# Patient Record
Sex: Male | Born: 1995 | Race: Black or African American | Hispanic: No | Marital: Single | Smoking: Never smoker
Health system: Southern US, Community
[De-identification: ages and names within clinical notes are randomized; demographics above are authoritative.]

## PROBLEM LIST (undated history)

## (undated) ENCOUNTER — Ambulatory Visit (HOSPITAL_COMMUNITY): Admission: EM | Payer: Self-pay | Source: Home / Self Care

## (undated) DIAGNOSIS — J302 Other seasonal allergic rhinitis: Secondary | ICD-10-CM

---
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
## 2015-05-27 ENCOUNTER — Encounter (HOSPITAL_COMMUNITY): Payer: Self-pay | Admitting: Emergency Medicine

## 2015-05-27 DIAGNOSIS — R197 Diarrhea, unspecified: Secondary | ICD-10-CM | POA: Insufficient documentation

## 2015-05-27 DIAGNOSIS — R112 Nausea with vomiting, unspecified: Secondary | ICD-10-CM | POA: Diagnosis present

## 2015-05-27 MED ORDER — ONDANSETRON 4 MG PO TBDP
ORAL_TABLET | ORAL | Status: AC
  Filled 2015-05-27: qty 1

## 2015-05-27 MED ORDER — ONDANSETRON 4 MG PO TBDP
4.0000 mg | ORAL_TABLET | Freq: Once | ORAL | Status: AC | PRN
  Administered 2015-05-27: 4 mg via ORAL

## 2015-05-27 NOTE — ED Notes (Signed)
Patient here with complaint of vomiting and diarrhea starting this AM upon waking. Denies sick contacts. Hasn't been able to tolerate PO intake today.

## 2015-05-28 ENCOUNTER — Emergency Department (HOSPITAL_COMMUNITY)
Admission: EM | Admit: 2015-05-28 | Discharge: 2015-05-28 | Disposition: A | Discharge status: Home / Self Care | Payer: Medicaid Other | Attending: Emergency Medicine | Admitting: Emergency Medicine

## 2015-05-28 DIAGNOSIS — R197 Diarrhea, unspecified: Secondary | ICD-10-CM

## 2015-05-28 DIAGNOSIS — R112 Nausea with vomiting, unspecified: Secondary | ICD-10-CM

## 2015-05-28 LAB — URINALYSIS, ROUTINE W REFLEX MICROSCOPIC
Glucose, UA: NEGATIVE mg/dL
HGB URINE DIPSTICK: NEGATIVE
KETONES UR: 40 mg/dL — AB
Leukocytes, UA: NEGATIVE
NITRITE: NEGATIVE
PH: 5.5 (ref 5.0–8.0)
Protein, ur: NEGATIVE mg/dL
SPECIFIC GRAVITY, URINE: 1.035 — AB (ref 1.005–1.030)
Urobilinogen, UA: 1 mg/dL (ref 0.0–1.0)

## 2015-05-28 LAB — COMPREHENSIVE METABOLIC PANEL
ALBUMIN: 4.3 g/dL (ref 3.5–5.0)
ALT: 18 U/L (ref 17–63)
AST: 28 U/L (ref 15–41)
Alkaline Phosphatase: 66 U/L (ref 38–126)
Anion gap: 7 (ref 5–15)
BILIRUBIN TOTAL: 1.8 mg/dL — AB (ref 0.3–1.2)
BUN: 10 mg/dL (ref 6–20)
CO2: 26 mmol/L (ref 22–32)
CREATININE: 1.08 mg/dL (ref 0.61–1.24)
Calcium: 9.7 mg/dL (ref 8.9–10.3)
Chloride: 103 mmol/L (ref 101–111)
GFR calc Af Amer: >60 mL/min (ref >60)
GLUCOSE: 106 mg/dL — AB (ref 65–99)
Potassium: 4.3 mmol/L (ref 3.5–5.1)
Sodium: 136 mmol/L (ref 135–145)
TOTAL PROTEIN: 7.4 g/dL (ref 6.5–8.1)

## 2015-05-28 LAB — CBC
HCT: 50.2 % (ref 39.0–52.0)
Hemoglobin: 17.1 g/dL — ABNORMAL HIGH (ref 13.0–17.0)
MCH: 28.7 pg (ref 26.0–34.0)
MCHC: 34.1 g/dL (ref 30.0–36.0)
MCV: 84.4 fL (ref 78.0–100.0)
PLATELETS: 238 10*3/uL (ref 150–400)
RBC: 5.95 MIL/uL — ABNORMAL HIGH (ref 4.22–5.81)
RDW: 12.8 % (ref 11.5–15.5)
WBC: 8.7 10*3/uL (ref 4.0–10.5)

## 2015-05-28 LAB — LIPASE, BLOOD: Lipase: 14 U/L — ABNORMAL LOW (ref 22–51)

## 2015-05-28 MED ORDER — LOPERAMIDE HCL 2 MG PO CAPS: 2.0000 mg | ORAL_CAPSULE | Freq: Four times a day (QID) | ORAL | Status: DC | PRN

## 2015-05-28 MED ORDER — LOPERAMIDE HCL 2 MG PO CAPS
2.0000 mg | ORAL_CAPSULE | Freq: Once | ORAL | Status: AC
  Administered 2015-05-28: 2 mg via ORAL
  Filled 2015-05-28: qty 1

## 2015-05-28 MED ORDER — SODIUM CHLORIDE 0.9 % IV SOLN
Freq: Once | INTRAVENOUS | Status: AC
  Administered 2015-05-28: 01:00:00 via INTRAVENOUS

## 2015-05-28 MED ORDER — ONDANSETRON 4 MG PO TBDP: 4.0000 mg | ORAL_TABLET | Freq: Three times a day (TID) | ORAL | Status: DC | PRN

## 2015-05-28 NOTE — ED Notes (Signed)
Pt verbalized understanding of d/c instructions and has no further questions. Pt stable and NAD.  

## 2015-05-28 NOTE — ED Notes (Signed)
Pt able to keep fluid down 

## 2015-05-28 NOTE — Discharge Instructions (Signed)

## 2015-05-28 NOTE — ED Provider Notes (Signed)
TIME SEEN: 1:16 AM   CHIEF COMPLAINT: Emesis and Diarrhea  HPI: Thomas Shepherd is a 19 y.o. male who presents to the Emergency Department complaining of emesis (5x) and diarrhea (4x) onset 1 day ago. He reports associated nausea, which he notes resolved after being administered Zofran at the ED. He denies sick contacts, recent travel, or consumption of bad food. He denies any medical issues. Pt denies fever, dysuria or hematuria, penile discharge, testicular pain, abdominal pain.  ROS: See HPI Constitutional: no fever  Eyes: no drainage  ENT: no runny nose   Cardiovascular:  no chest pain  Resp: no SOB  GI: vomiting GU: no dysuria Integumentary: no rash  Allergy: no hives  Musculoskeletal: no leg swelling  Neurological: no slurred speech ROS otherwise negative  PAST MEDICAL HISTORY/PAST SURGICAL HISTORY:  Past Medical History  Diagnosis Date  . Seasonal allergies     MEDICATIONS:  Prior to Admission medications   Medication Sig Start Date End Date Taking? Authorizing Provider  HYDROcodone-acetaminophen (NORCO/VICODIN) 5-325 MG per tablet Take 1-2 tablets by mouth every 6 (six) hours as needed for severe pain. 03/29/14   Jennifer Piepenbrink, PA-C  ibuprofen (ADVIL,MOTRIN) 800 MG tablet Take 1 tablet (800 mg total) by mouth 3 (three) times daily. 03/29/14   Francee Piccolo, PA-C  PRESCRIPTION MEDICATION Take 1 tablet by mouth daily as needed. For allergies. PT and mom are unsure of the name of medication. Pharmacy is closed    Historical Provider, MD    ALLERGIES:  No Known Allergies  SOCIAL HISTORY:  Social History  Substance Use Topics  . Smoking status: Never Smoker   . Smokeless tobacco: Not on file  . Alcohol Use: No    FAMILY HISTORY: History reviewed. No pertinent family history.  EXAM: BP 122/79 mmHg  Pulse 81  Temp(Src) 98.5 F (36.9 C) (Oral)  Resp 18  SpO2 100% CONSTITUTIONAL: Alert and oriented and responds appropriately to questions. Well-appearing;  well-nourished HEAD: Normocephalic EYES: Conjunctivae clear, PERRL ENT: normal nose; no rhinorrhea; slightly dry mucous membranes; pharynx without lesions noted NECK: Supple, no meningismus, no LAD  CARD: RRR; S1 and S2 appreciated; no murmurs, no clicks, no rubs, no gallops RESP: Normal chest excursion without splinting or tachypnea; breath sounds clear and equal bilaterally; no wheezes, no rhonchi, no rales, no hypoxia or respiratory distress, speaking full sentences ABD/GI: Normal bowel sounds; non-distended; soft, non-tender, no rebound, no guarding, no peritoneal signs, no tenderness at McBurney's point, negative Murphy sign BACK:  The back appears normal and is non-tender to palpation, there is no CVA tenderness EXT: Normal ROM in all joints; non-tender to palpation; no edema; normal capillary refill; no cyanosis, no calf tenderness or swelling    SKIN: Normal color for age and race; warm NEURO: Moves all extremities equally, sensation to light touch intact diffusely, cranial nerves II through XII intact PSYCH: The patient's mood and manner are appropriate. Grooming and personal hygiene are appropriate.  MEDICAL DECISION MAKING: Patient here with likely viral gastroenteritis. Abdominal exam benign. Hemodynamically stable. Afebrile.  Labs unremarkable. Urine shows ketones but no sign of infection. He has received IV fluids in the emergency department and is drinking without difficulty. No further vomiting or diarrhea. I do not feel he needs further emergent workup. Doubt appendicitis. Discussed return precautions. We'll discharge with Zofran and Imodium prescriptions. He verbalizes understanding is comfortable with this plan.   I personally performed the services described in this documentation, which was scribed in my presence. The recorded information  has been reviewed and is accurate.   Layla Maw Kamorie Aldous, DO 05/28/15 [phone]

## 2015-12-18 ENCOUNTER — Emergency Department
Admission: EM | Admit: 2015-12-18 | Discharge: 2015-12-19 | Disposition: A | Discharge status: Home / Self Care | Payer: Medicaid Other | Attending: Emergency Medicine | Admitting: Emergency Medicine

## 2015-12-18 DIAGNOSIS — R110 Nausea: Secondary | ICD-10-CM | POA: Insufficient documentation

## 2015-12-18 DIAGNOSIS — R509 Fever, unspecified: Secondary | ICD-10-CM | POA: Insufficient documentation

## 2015-12-18 DIAGNOSIS — K529 Noninfective gastroenteritis and colitis, unspecified: Secondary | ICD-10-CM | POA: Insufficient documentation

## 2015-12-18 MED ORDER — ONDANSETRON HCL 4 MG PO TABS: 4.0000 mg | ORAL_TABLET | Freq: Every day | ORAL | Status: DC | PRN

## 2015-12-18 MED ORDER — ONDANSETRON 8 MG PO TBDP
8.0000 mg | ORAL_TABLET | Freq: Once | ORAL | Status: AC
  Administered 2015-12-19: 8 mg via ORAL
  Filled 2015-12-18: qty 1

## 2015-12-18 MED ORDER — KETOROLAC TROMETHAMINE 30 MG/ML IJ SOLN
30.0000 mg | Freq: Once | INTRAMUSCULAR | Status: AC
  Administered 2015-12-19: 30 mg via INTRAMUSCULAR
  Filled 2015-12-18: qty 1

## 2015-12-18 NOTE — ED Provider Notes (Signed)
[facility] Emergency Department Provider Note  ____________________________________________  Time seen: On arrival  I have reviewed the triage vital signs and the nursing notes.   HISTORY  Chief Complaint Fever; Nausea; and Diarrhea    HPI Thomas Shepherd is a 20 y.o. male who presents with complaints of nausea, diarrhea and abdominal cramping started today. He reports he has felt hot and cold but is not sure if he has had a fever. No recent travel. He reports for episodes of diarrhea today. No vomiting. He does report a sick contact with the flu.    Past Medical History  Diagnosis Date  . Seasonal allergies     There are no active problems to display for this patient.   History reviewed. No pertinent past surgical history.  Current Outpatient Rx  Name  Route  Sig  Dispense  Refill  . HYDROcodone-acetaminophen (NORCO/VICODIN) 5-325 MG per tablet   Oral   Take 1-2 tablets by mouth every 6 (six) hours as needed for severe pain.   10 tablet   0   . ibuprofen (ADVIL,MOTRIN) 800 MG tablet   Oral   Take 1 tablet (800 mg total) by mouth 3 (three) times daily.   21 tablet   0   . loperamide (IMODIUM) 2 MG capsule   Oral   Take 1 capsule (2 mg total) by mouth 4 (four) times daily as needed for diarrhea or loose stools.   12 capsule   0   . ondansetron (ZOFRAN ODT) 4 MG disintegrating tablet   Oral   Take 1 tablet (4 mg total) by mouth every 8 (eight) hours as needed for nausea or vomiting.   20 tablet   0   . ondansetron (ZOFRAN) 4 MG tablet   Oral   Take 1 tablet (4 mg total) by mouth daily as needed for nausea or vomiting.   20 tablet   1   . PRESCRIPTION MEDICATION   Oral   Take 1 tablet by mouth daily as needed. For allergies. PT and mom are unsure of the name of medication. Pharmacy is closed           Allergies Review of patient's allergies indicates no known allergies.  No family history on file.  Social History Social  History  Substance Use Topics  . Smoking status: Never Smoker   . Smokeless tobacco: None  . Alcohol Use: No    Review of Systems  Constitutional: Subjective fever  ENT: Negative for sore throat Abdominal: Positive for cramping, positive for diarrhea   Musculoskeletal: Negative for back pain. No myalgias Skin: Negative for rash. Neurological: Negative for headaches    ____________________________________________   PHYSICAL EXAM:  VITAL SIGNS: ED Triage Vitals  Enc Vitals Group     BP 12/18/15 2225 113/72 mmHg     Pulse Rate 12/18/15 2225 81     Resp 12/18/15 2225 17     Temp 12/18/15 2225 98.3 F (36.8 C)     Temp Source 12/18/15 2225 Oral     SpO2 12/18/15 2225 99 %     Weight 12/18/15 2225 234 lb 1 oz (106.17 kg)     Height 12/18/15 2225  (1.93 m)     Head Cir --      Peak Flow --      Pain Score 12/18/15 2228 5     Pain Loc --      Pain Edu? --      Excl. in GC? --  Constitutional: Alert and oriented. Well appearing and in no distress. Eyes: Conjunctivae are normal.  ENT   Head: Normocephalic and atraumatic.   Mouth/Throat: Mucous membranes are moist. Cardiovascular: Normal rate, regular rhythm.  Respiratory: Normal respiratory effort without tachypnea nor retractions.  Gastrointestinal: Soft and non-tender in all quadrants. No distention. There is no CVA tenderness. Musculoskeletal: Nontender with normal range of motion in all extremities. Neurologic:  Normal speech and language. No gross focal neurologic deficits are appreciated. Skin:  Skin is warm, dry and intact. No rash noted. Psychiatric: Mood and affect are normal. Patient exhibits appropriate insight and judgment.  ____________________________________________    LABS (pertinent positives/negatives)  Labs Reviewed - No data to display  ____________________________________________     ____________________________________________    RADIOLOGY I have personally reviewed  any xrays that were ordered on this patient: None  ____________________________________________   PROCEDURES  Procedure(s) performed: none   ____________________________________________   INITIAL IMPRESSION / ASSESSMENT AND PLAN / ED COURSE  Pertinent labs & imaging results that were available during my care of the patient were reviewed by me and considered in my medical decision making (see chart for details).  Patient overall well-appearing with normal vitals. He has no abdominal tenderness to palpation. History of present illness is most consistent with viral gastroenteritis which is rampant indicated this time. Zofran ODT provided. Follow-up with PCP or return to the ED if worsening symptoms  ____________________________________________   FINAL CLINICAL IMPRESSION(S) / ED DIAGNOSES  Final diagnoses:  Gastroenteritis     Jene Everyobert Dorrance Sellick, MD 12/18/15 [phone]

## 2015-12-18 NOTE — ED Notes (Signed)
Patient presents with vague complaints of hot and cold feeling with hot water in his mouth like was going to vomit although never did. Patient states a family member recently tested positive for the flu. Patient is unsure about fever because he didn't measure it. Four or five episodes of diarrhea since waking this morning. Skin warm/dry, mucus membranes moist.

## 2018-02-28 ENCOUNTER — Emergency Department (HOSPITAL_COMMUNITY)
Admission: EM | Admit: 2018-02-28 | Discharge: 2018-02-28 | Disposition: A | Discharge status: Home / Self Care | Payer: Self-pay | Attending: Emergency Medicine | Admitting: Emergency Medicine

## 2018-02-28 ENCOUNTER — Encounter (HOSPITAL_COMMUNITY): Payer: Self-pay | Admitting: Emergency Medicine

## 2018-02-28 DIAGNOSIS — R51 Headache: Secondary | ICD-10-CM | POA: Insufficient documentation

## 2018-02-28 DIAGNOSIS — Z5321 Procedure and treatment not carried out due to patient leaving prior to being seen by health care provider: Secondary | ICD-10-CM | POA: Insufficient documentation

## 2018-02-28 MED ORDER — IBUPROFEN 400 MG PO TABS
400.0000 mg | ORAL_TABLET | Freq: Once | ORAL | Status: AC
  Administered 2018-02-28: 400 mg via ORAL
  Filled 2018-02-28: qty 1

## 2018-02-28 MED ORDER — ONDANSETRON 4 MG PO TBDP
4.0000 mg | ORAL_TABLET | Freq: Once | ORAL | Status: AC
  Administered 2018-02-28: 4 mg via ORAL
  Filled 2018-02-28: qty 1

## 2018-02-28 NOTE — ED Notes (Signed)
The pt reports that his headache is better and he just wants to lie down at home

## 2018-02-28 NOTE — ED Triage Notes (Signed)
Pt reports headache onset this morning upon waking and some nausea. Reports body aches. Hx migraines,.

## 2018-11-04 ENCOUNTER — Emergency Department (HOSPITAL_COMMUNITY)
Admission: EM | Admit: 2018-11-04 | Discharge: 2018-11-05 | Disposition: A | Discharge status: Home / Self Care | Payer: Self-pay | Attending: Emergency Medicine | Admitting: Emergency Medicine

## 2018-11-04 ENCOUNTER — Encounter (HOSPITAL_COMMUNITY): Payer: Self-pay | Admitting: Emergency Medicine

## 2018-11-04 ENCOUNTER — Emergency Department (HOSPITAL_COMMUNITY): Payer: Self-pay

## 2018-11-04 DIAGNOSIS — R0789 Other chest pain: Secondary | ICD-10-CM | POA: Insufficient documentation

## 2018-11-04 LAB — RAPID URINE DRUG SCREEN, HOSP PERFORMED
AMPHETAMINES: NOT DETECTED
BENZODIAZEPINES: NOT DETECTED
Barbiturates: NOT DETECTED
COCAINE: NOT DETECTED
Opiates: NOT DETECTED
TETRAHYDROCANNABINOL: NOT DETECTED

## 2018-11-04 LAB — BASIC METABOLIC PANEL
ANION GAP: 7 (ref 5–15)
BUN: 7 mg/dL (ref 6–20)
CHLORIDE: 104 mmol/L (ref 98–111)
CO2: 28 mmol/L (ref 22–32)
CREATININE: 1.11 mg/dL (ref 0.61–1.24)
Calcium: 9.4 mg/dL (ref 8.9–10.3)
GFR calc non Af Amer: >60 mL/min (ref >60)
Glucose, Bld: 121 mg/dL — ABNORMAL HIGH (ref 70–99)
POTASSIUM: 3.7 mmol/L (ref 3.5–5.1)
Sodium: 139 mmol/L (ref 135–145)

## 2018-11-04 LAB — CBC
HEMATOCRIT: 49.8 % (ref 39.0–52.0)
HEMOGLOBIN: 15.8 g/dL (ref 13.0–17.0)
MCH: 28.1 pg (ref 26.0–34.0)
MCHC: 31.7 g/dL (ref 30.0–36.0)
MCV: 88.6 fL (ref 80.0–100.0)
NRBC: 0 % (ref 0.0–0.2)
Platelets: 265 10*3/uL (ref 150–400)
RBC: 5.62 MIL/uL (ref 4.22–5.81)
RDW: 12.7 % (ref 11.5–15.5)
WBC: 6 10*3/uL (ref 4.0–10.5)

## 2018-11-04 LAB — POCT I-STAT TROPONIN I: TROPONIN I, POC: 0 ng/mL (ref 0.00–0.08)

## 2018-11-04 MED ORDER — IBUPROFEN 200 MG PO TABS
600.0000 mg | ORAL_TABLET | Freq: Once | ORAL | Status: AC
  Administered 2018-11-04: 600 mg via ORAL
  Filled 2018-11-04: qty 3

## 2018-11-04 MED ORDER — SODIUM CHLORIDE 0.9% FLUSH: 3.0000 mL | Freq: Once | INTRAVENOUS | Status: DC

## 2018-11-04 NOTE — ED Provider Notes (Addendum)
[facility]-EMERGENCY DEPT Provider Note   CSN: [id number] Arrival date & time: 11/04/18  1904     History   Chief Complaint Chief Complaint  Patient presents with  . Chest Pain    HPI Thomas Shepherd is a 23 y.o. male.  23 y/o make with no PMH presents to the ED with a chief complaint of chest pain x 1 month. Patient describes his chest pain as intermittent sharp pain located in the center of his chest with radiation to the left side. He reports this intermittent chest pain lasts 10 about 10 minutes. He denies any alleviating or exacerbating factors. He has not tried any medical therapy for relieve in symptoms. He denies any shortness of breath, abdominal pain, fever, previous history of smoking or prior cardiac history. Denies any substance abuse.      Past Medical History:  Diagnosis Date  . Seasonal allergies         Home Medications    Prior to Admission medications   Medication Sig Start Date End Date Taking? Authorizing Provider  loperamide (IMODIUM) 2 MG capsule Take 1 capsule (2 mg total) by mouth 4 (four) times daily as needed for diarrhea or loose stools. Patient not taking: Reported on 11/04/2018 05/28/15   Ward, Layla MawKristen N, DO    Family History No family history on file.  Social History Social History   Tobacco Use  . Smoking status: Never Smoker  . Smokeless tobacco: Never Used  Substance Use Topics  . Alcohol use: No  . Drug use: No     Allergies   Patient has no known allergies.   Review of Systems Review of Systems  Constitutional: Negative for chills and fever.  HENT: Negative for ear pain and sore throat.   Eyes: Negative for pain and visual disturbance.  Respiratory: Negative for cough and shortness of breath.   Cardiovascular: Positive for chest pain. Negative for palpitations.  Gastrointestinal: Negative for abdominal pain and vomiting.  Genitourinary: Negative for dysuria and hematuria.  Musculoskeletal:  Negative for arthralgias and back pain.  Skin: Negative for color change and rash.  Neurological: Negative for seizures and syncope.  All other systems reviewed and are negative.    Physical Exam Updated Vital Signs BP 125/71   Pulse (!) 47   Temp 97.8 F (36.6 C) (Oral)   Resp 16   Ht 6\' 5"  (1.956 m)   Wt 108.9 kg   SpO2 98%   BMI 28.46 kg/m   Physical Exam Vitals signs and nursing note reviewed.  Constitutional:      Appearance: He is well-developed. He is not ill-appearing.     Comments: Well appearing.   HENT:     Head: Normocephalic and atraumatic.  Eyes:     General: No scleral icterus.    Pupils: Pupils are equal, round, and reactive to light.  Neck:     Musculoskeletal: Normal range of motion.  Cardiovascular:     Heart sounds: Normal heart sounds.  Pulmonary:     Effort: Pulmonary effort is normal.     Breath sounds: Decreased breath sounds present. No wheezing.  Chest:     Chest wall: No tenderness.  Abdominal:     General: Bowel sounds are normal. There is no distension.     Palpations: Abdomen is soft.     Tenderness: There is no abdominal tenderness. There is no right CVA tenderness or left CVA tenderness.     Comments: No abdominal tenderness.  Musculoskeletal:        General: No tenderness or deformity.  Skin:    General: Skin is warm and dry.  Neurological:     Mental Status: He is alert and oriented to person, place, and time.      ED Treatments / Results  Labs (all labs ordered are listed, but only abnormal results are displayed) Labs Reviewed  BASIC METABOLIC PANEL - Abnormal; Notable for the following components:      Result Value   Glucose, Bld 121 (*)    All other components within normal limits  CBC  RAPID URINE DRUG SCREEN, HOSP PERFORMED  I-STAT TROPONIN, ED  POCT I-STAT TROPONIN I    EKG None  Radiology Dg Chest 2 View  Result Date: 11/04/2018 CLINICAL DATA:  23 year old male with a history chest pain EXAM: CHEST - 2  VIEW COMPARISON:  None. FINDINGS: The heart size and mediastinal contours are within normal limits. Both lungs are clear. The visualized skeletal structures are unremarkable. IMPRESSION: Negative for acute cardiopulmonary disease Electronically Signed   By: Gilmer Mor D.O.   On: 11/04/2018 20:48    Procedures Procedures (including critical care time)  Medications Ordered in ED Medications  ibuprofen (ADVIL,MOTRIN) tablet 600 mg (600 mg Oral Given 11/04/18 2148)     Initial Impression / Assessment and Plan / ED Course  I have reviewed the triage vital signs and the nursing notes.  Pertinent labs & imaging results that were available during my care of the patient were reviewed by me and considered in my medical decision making (see chart for details).    Patient presents with chest pain x 1 month. He ha snot tried any medication for relieve in symptoms. He states the pain is not alleviated or exacerbated by any factors. CBC showed no leukocytosis, hemoglobin is within normal limits. BMP showed no electrolyte abnormality, creatine is within normal limits, glucose is elevated. DG Chest 2 view showed no acute process, first troponin was negative, EKG showed no changes consistent with infarct or STEMI.  Heart Score is 0 at this time, low suspicion for ACS. Patient reports improvement of pain with ibuprofen, afebrile, low heart rate but consistent with previous visits.    11:35 PM Patient reports he is pain free, low suspicion for ACS think his chest pain is likely pleuritic. Will give him referral for [facility], he is advised to establish PCP care with them.  Patient understands and agrees with plan.  Return precautions provided at length.  Vitals stable for discharge.  Final Clinical Impressions(s) / ED Diagnoses   Final diagnoses:  Atypical chest pain    ED Discharge Orders    None       Claude Manges, PA-C 11/04/18 2334    Claude Manges, PA-C 11/04/18  2336    Charlynne Pander, MD 11/04/18 [phone]

## 2018-11-04 NOTE — Discharge Instructions (Addendum)
Your laboratory results were within normal limit today. Your chest xray was within normal limits. You may alternate ibuprofen or tylenol for your pain. Please follow up with your PCP as needed.

## 2018-11-04 NOTE — ED Triage Notes (Signed)
Patient here from home with complaints of chest pain that started 1 month ago. Denies n/v. "I just need to get this checked out".

## 2019-05-26 ENCOUNTER — Other Ambulatory Visit: Payer: Self-pay | Admitting: Emergency Medicine

## 2019-05-26 DIAGNOSIS — Z20822 Contact with and (suspected) exposure to covid-19: Secondary | ICD-10-CM

## 2019-05-27 LAB — NOVEL CORONAVIRUS, NAA: SARS-CoV-2, NAA: NOT DETECTED

## 2019-07-23 ENCOUNTER — Encounter (HOSPITAL_COMMUNITY): Payer: Self-pay

## 2019-07-23 ENCOUNTER — Other Ambulatory Visit: Payer: Self-pay

## 2019-07-23 ENCOUNTER — Ambulatory Visit (HOSPITAL_COMMUNITY)
Admission: EM | Admit: 2019-07-23 | Discharge: 2019-07-23 | Disposition: A | Discharge status: Home / Self Care | Payer: Self-pay | Attending: Family Medicine | Admitting: Family Medicine

## 2019-07-23 DIAGNOSIS — R197 Diarrhea, unspecified: Secondary | ICD-10-CM

## 2019-07-23 DIAGNOSIS — A084 Viral intestinal infection, unspecified: Secondary | ICD-10-CM

## 2019-07-23 MED ORDER — ONDANSETRON 4 MG PO TBDP: 4.0000 mg | ORAL_TABLET | Freq: Three times a day (TID) | ORAL | 0 refills | Status: DC | PRN

## 2019-07-23 NOTE — Discharge Instructions (Signed)

## 2019-07-23 NOTE — ED Triage Notes (Signed)
Pt presents to UC w/ c/o diarrhea and stomach pains x2 days. Pt states it started after he ate at a [employer].

## 2019-07-23 NOTE — ED Provider Notes (Signed)
[facility]    CSN: [id number] Arrival date & time: 07/23/19  1158      History   Chief Complaint Chief Complaint  Patient presents with  . diarrhea, stomach cramps    HPI Thomas Shepherd is a 23 y.o. male history of allergic rhinitis, presenting today for evaluation of diarrhea.  Patient states that yesterday morning he woke up with diarrhea.  He believes this to be related to eating [place] the night before.  He has had persistent watery bowel movements into today.  Yesterday he had some mild cramping, but this has improved.  He has noticed a decrease in frequency of bowels, but continues to have bowel movement shortly after eating.  He has tried eating fluids, but has not eaten a lot of solids.  Denies any nausea or vomiting.  Denies URI symptoms of cough congestion or sore throat.  Denies any fevers chills or body aches.  Denies known close contacts with similar symptoms.  Denies known exposure to Covid.  HPI  Past Medical History:  Diagnosis Date  . Seasonal allergies     There are no active problems to display for this patient.   History reviewed. No pertinent surgical history.     Home Medications    Prior to Admission medications   Medication Sig Start Date End Date Taking? Authorizing Provider  ondansetron (ZOFRAN ODT) 4 MG disintegrating tablet Take 1 tablet (4 mg total) by mouth every 8 (eight) hours as needed for nausea or vomiting. 07/23/19   Wieters, Junius Creamer, PA-C    Family History Family History  Problem Relation Age of Onset  . Healthy Mother   . Healthy Father     Social History Social History   Tobacco Use  . Smoking status: Never Smoker  . Smokeless tobacco: Never Used  Substance Use Topics  . Alcohol use: No  . Drug use: No     Allergies   Patient has no known allergies.   Review of Systems Review of Systems  Constitutional: Negative for activity change, appetite change, chills, fatigue and fever.  HENT: Negative for  congestion, ear pain, rhinorrhea, sinus pressure, sore throat and trouble swallowing.   Eyes: Negative for discharge and redness.  Respiratory: Negative for cough, chest tightness and shortness of breath.   Cardiovascular: Negative for chest pain.  Gastrointestinal: Positive for diarrhea. Negative for abdominal pain, nausea and vomiting.  Musculoskeletal: Negative for myalgias.  Skin: Negative for rash.  Neurological: Negative for dizziness, light-headedness and headaches.     Physical Exam Triage Vital Signs ED Triage Vitals  Enc Vitals Group     BP 07/23/19 1222 134/64     Pulse Rate 07/23/19 1222 (!) 58     Resp 07/23/19 1222 16     Temp 07/23/19 1222 99 F (37.2 C)     Temp Source 07/23/19 1222 Oral     SpO2 07/23/19 1222 99 %     Weight --      Height --      Head Circumference --      Peak Flow --      Pain Score 07/23/19 1226 0     Pain Loc --      Pain Edu? --      Excl. in GC? --    No data found.  Updated Vital Signs BP 134/64 (BP Location: Right Arm)   Pulse (!) 58   Temp 99 F (37.2 C) (Oral)   Resp 16   SpO2  99%   Visual Acuity Right Eye Distance:   Left Eye Distance:   Bilateral Distance:    Right Eye Near:   Left Eye Near:    Bilateral Near:     Physical Exam Vitals signs and nursing note reviewed.  Constitutional:      Appearance: He is well-developed.     Comments: No acute distress  HENT:     Head: Normocephalic and atraumatic.     Mouth/Throat:     Comments: Oral mucosa pink and moist, no tonsillar enlargement or exudate. Posterior pharynx patent and nonerythematous, no uvula deviation or swelling. Normal phonation.  Eyes:     Conjunctiva/sclera: Conjunctivae normal.  Neck:     Musculoskeletal: Neck supple.  Cardiovascular:     Rate and Rhythm: Normal rate and regular rhythm.     Heart sounds: No murmur.  Pulmonary:     Effort: Pulmonary effort is normal. No respiratory distress.     Breath sounds: Normal breath sounds.      Comments: Breathing comfortably at rest, CTABL, no wheezing, rales or other adventitious sounds auscultated Abdominal:     Palpations: Abdomen is soft.     Tenderness: There is no abdominal tenderness.     Comments: Soft, nondistended, nontender to light and deep palpation throughout all 4 quadrants and epigastrium abdomen  Skin:    General: Skin is warm and dry.  Neurological:     Mental Status: He is alert.      UC Treatments / Results  Labs (all labs ordered are listed, but only abnormal results are displayed) Labs Reviewed - No data to display  EKG   Radiology No results found.  Procedures Procedures (including critical care time)  Medications Ordered in UC Medications - No data to display  Initial Impression / Assessment and Plan / UC Course  I have reviewed the triage vital signs and the nursing notes.  Pertinent labs & imaging results that were available during my care of the patient were reviewed by me and considered in my medical decision making (see chart for details).     2 days of diarrhea, tolerating liquids, no abdominal tenderness on exam, do not suspect abdominal emergency.  Vital signs stable, no signs of dehydration at this time.  Most likely viral etiology and recommending symptomatic and supportive care with continued close monitoring.  Discussed transitioning diet.  Importance of fluid hydration.  Zofran as needed to help with any decreased appetite/nausea.  Discussed strict return precautions. Patient verbalized understanding and is agreeable with plan.  Final Clinical Impressions(s) / UC Diagnoses   Final diagnoses:  Diarrhea, unspecified type  Viral gastroenteritis     Discharge Instructions     Your nausea, vomiting, and diarrhea appear to have a viral cause. Your symptoms should improve over the next week as your body continues to rid the infectious cause.  For nausea: Zofran prescribed. Begin with every 6 hours, than as you are able to  hold food down, take it as needed. Start with clear liquids, then move to plain foods like bananas, rice, applesauce, toast, broth, grits, oatmeal. As those food settle okay you may transition to your normal foods. Avoid spicy and greasy foods as much as possible.  For Diarrhea: This is your body's natural way of getting rid of a virus. You may try taking 1 imodium to decrease amount of stools a day, but we do not want you to stop your diarrhea.   Preventing dehydration is key! You need to replace  the fluid your body is expelling. Drink plenty of fluids, may use Pedialyte or sports drinks.   Please return if you are experiencing blood in your vomit or stool or experiencing dizziness, lightheadedness, extreme fatigue, increased abdominal pain.     ED Prescriptions    Medication Sig Dispense Auth. Provider   ondansetron (ZOFRAN ODT) 4 MG disintegrating tablet Take 1 tablet (4 mg total) by mouth every 8 (eight) hours as needed for nausea or vomiting. 20 tablet Wieters, [place] C, PA-C     PDMP not reviewed this encounter.   Janith Lima, [place] 07/23/19 1251

## 2019-10-17 ENCOUNTER — Emergency Department (HOSPITAL_COMMUNITY)
Admission: EM | Admit: 2019-10-17 | Discharge: 2019-10-17 | Disposition: A | Discharge status: Home / Self Care | Payer: Self-pay | Attending: Emergency Medicine | Admitting: Emergency Medicine

## 2019-10-17 ENCOUNTER — Encounter (HOSPITAL_COMMUNITY): Payer: Self-pay

## 2019-10-17 ENCOUNTER — Emergency Department (HOSPITAL_COMMUNITY): Payer: Self-pay

## 2019-10-17 ENCOUNTER — Other Ambulatory Visit: Payer: Self-pay

## 2019-10-17 DIAGNOSIS — M25512 Pain in left shoulder: Secondary | ICD-10-CM | POA: Insufficient documentation

## 2019-10-17 DIAGNOSIS — R0789 Other chest pain: Secondary | ICD-10-CM | POA: Insufficient documentation

## 2019-10-17 LAB — CBC
HCT: 48.1 % (ref 39.0–52.0)
Hemoglobin: 15.9 g/dL (ref 13.0–17.0)
MCH: 28.5 pg (ref 26.0–34.0)
MCHC: 33.1 g/dL (ref 30.0–36.0)
MCV: 86.4 fL (ref 80.0–100.0)
Platelets: 256 10*3/uL (ref 150–400)
RBC: 5.57 MIL/uL (ref 4.22–5.81)
RDW: 12.8 % (ref 11.5–15.5)
WBC: 8.2 10*3/uL (ref 4.0–10.5)
nRBC: 0 % (ref 0.0–0.2)

## 2019-10-17 LAB — BASIC METABOLIC PANEL
Anion gap: 10 (ref 5–15)
BUN: 8 mg/dL (ref 6–20)
CO2: 26 mmol/L (ref 22–32)
Calcium: 9.6 mg/dL (ref 8.9–10.3)
Chloride: 104 mmol/L (ref 98–111)
Creatinine, Ser: 1.12 mg/dL (ref 0.61–1.24)
GFR calc Af Amer: >60 mL/min (ref >60)
GFR calc non Af Amer: >60 mL/min (ref >60)
Glucose, Bld: 107 mg/dL — ABNORMAL HIGH (ref 70–99)
Potassium: 3.8 mmol/L (ref 3.5–5.1)
Sodium: 140 mmol/L (ref 135–145)

## 2019-10-17 LAB — TROPONIN I (HIGH SENSITIVITY): Troponin I (High Sensitivity): 3 ng/L (ref <18)

## 2019-10-17 MED ORDER — ACETAMINOPHEN 500 MG PO TABS
1000.0000 mg | ORAL_TABLET | Freq: Once | ORAL | Status: AC
  Administered 2019-10-17: 1000 mg via ORAL
  Filled 2019-10-17: qty 2

## 2019-10-17 NOTE — ED Notes (Signed)
Pt to Xray.

## 2019-10-17 NOTE — ED Triage Notes (Signed)
Pt arrives POV for eval of chest pain radiating to L shoulder x 2 weeks. Pt denies associated N/V, diaphoresis or SOB. States pain is not getting better, so he wanted to get checked out. NAD in triage.

## 2019-10-17 NOTE — ED Provider Notes (Signed)
[facility] EMERGENCY DEPARTMENT Provider Note   CSN: [id number] Arrival date & time: 10/17/19  2104     History Chief Complaint  Patient presents with  . Chest Pain  . Shoulder Pain    Thomas Shepherd is a 24 y.o. male.  Patient with no significant medical history, no family history of cardiac at young age presents with left chest and shoulder pain for 2 weeks worse with movement and position.  No direct injury.  No fever chills or infectious symptoms.  No shortness of breath or blood clot risk factors.  Sharp sensation.        Past Medical History:  Diagnosis Date  . Seasonal allergies     There are no problems to display for this patient.   History reviewed. No pertinent surgical history.     Family History  Problem Relation Age of Onset  . Healthy Mother   . Healthy Father     Social History   Tobacco Use  . Smoking status: Never Smoker  . Smokeless tobacco: Never Used  Substance Use Topics  . Alcohol use: No  . Drug use: No    Home Medications Prior to Admission medications   Medication Sig Start Date End Date Taking? Authorizing Provider  ondansetron (ZOFRAN ODT) 4 MG disintegrating tablet Take 1 tablet (4 mg total) by mouth every 8 (eight) hours as needed for nausea or vomiting. Patient not taking: Reported on 10/17/2019 07/23/19   Patterson Hammersmith C, PA-C    Allergies    Patient has no known allergies.  Review of Systems   Review of Systems  Constitutional: Negative for chills and fever.  HENT: Negative for congestion.   Eyes: Negative for visual disturbance.  Respiratory: Negative for shortness of breath.   Cardiovascular: Positive for chest pain. Negative for leg swelling.  Gastrointestinal: Negative for abdominal pain and vomiting.  Genitourinary: Negative for dysuria and flank pain.  Musculoskeletal: Negative for back pain, neck pain and neck stiffness.  Skin: Negative for rash.  Neurological: Negative for  light-headedness and headaches.    Physical Exam Updated Vital Signs BP 122/76   Pulse (!) 55   Temp 97.8 F (36.6 C) (Oral)   Resp 18   Ht 6\' 5"  (1.956 m)   Wt 111.1 kg   SpO2 99%   BMI 29.05 kg/m   Physical Exam Vitals and nursing note reviewed.  Constitutional:      Appearance: He is well-developed.  HENT:     Head: Normocephalic and atraumatic.  Eyes:     General:        Right eye: No discharge.        Left eye: No discharge.     Conjunctiva/sclera: Conjunctivae normal.  Neck:     Trachea: No tracheal deviation.  Cardiovascular:     Rate and Rhythm: Normal rate and regular rhythm.  Pulmonary:     Effort: Pulmonary effort is normal.     Breath sounds: Normal breath sounds.  Abdominal:     General: There is no distension.     Palpations: Abdomen is soft.     Tenderness: There is no abdominal tenderness. There is no guarding.  Musculoskeletal:     Cervical back: Normal range of motion and neck supple.     Right lower leg: No tenderness. No edema.     Left lower leg: No tenderness. No edema.     Comments: Patient is tenderness to palpation left upper pectoralis region worse with adduction  of left arm and shoulder.  Skin:    General: Skin is warm.     Findings: No rash.  Neurological:     Mental Status: He is alert and oriented to person, place, and time.     ED Results / Procedures / Treatments   Labs (all labs ordered are listed, but only abnormal results are displayed) Labs Reviewed  BASIC METABOLIC PANEL - Abnormal; Notable for the following components:      Result Value   Glucose, Bld 107 (*)    All other components within normal limits  CBC  TROPONIN I (HIGH SENSITIVITY)    EKG EKG Interpretation  Date/Time:  Sunday October 17 2019 21:12:43 EST Ventricular Rate:  56 PR Interval:  134 QRS Duration: 104 QT Interval:  410 QTC Calculation: 395 R Axis:   71 Text Interpretation: Sinus bradycardia with sinus arrhythmia Otherwise normal ECG  Confirmed by Elnora Morrison [phone]) on 10/17/2019 9:34:45 PM   Radiology DG Chest 2 View  Result Date: 10/17/2019 CLINICAL DATA:  Chest pain. EXAM: CHEST - 2 VIEW COMPARISON:  11/04/2018 FINDINGS: The heart size and mediastinal contours are within normal limits. Both lungs are clear. The visualized skeletal structures are unremarkable. IMPRESSION: No active cardiopulmonary disease. Electronically Signed   By: Constance Holster M.D.   On: 10/17/2019 21:31    Procedures Procedures (including critical care time)  Medications Ordered in ED Medications  acetaminophen (TYLENOL) tablet 1,000 mg (has no administration in time range)    ED Course  I have reviewed the triage vital signs and the nursing notes.  Pertinent labs & imaging results that were available during my care of the patient were reviewed by me and considered in my medical decision making (see chart for details).    MDM Rules/Calculators/A&P                      Patient presents with clinically musculoskeletal type chest pain.  Tylenol ordered for pain.  Patient has screening blood work and troponin done on arrival and results reviewed no acute findings.  EKG no signs of ischemia.  Chest x-ray reviewed no acute findings.  Patient stable for outpatient follow-up with primary doctor Final Clinical Impression(s) / ED Diagnoses Final diagnoses:  Chest wall pain    Rx / DC Orders ED Discharge Orders    None       Elnora Morrison, MD 10/17/19 2207

## 2019-10-17 NOTE — Discharge Instructions (Signed)
Return for new or worsening symptoms which may include persistent fevers, severe chest pain, shortness of breath, symptoms with exertion or new concerns.

## 2019-11-19 ENCOUNTER — Encounter (HOSPITAL_COMMUNITY): Payer: Self-pay | Admitting: Emergency Medicine

## 2019-11-19 ENCOUNTER — Emergency Department (HOSPITAL_COMMUNITY)
Admission: EM | Admit: 2019-11-19 | Discharge: 2019-11-20 | Disposition: A | Discharge status: Home / Self Care | Payer: Self-pay | Attending: Emergency Medicine | Admitting: Emergency Medicine

## 2019-11-19 ENCOUNTER — Other Ambulatory Visit: Payer: Self-pay

## 2019-11-19 DIAGNOSIS — Z20822 Contact with and (suspected) exposure to covid-19: Secondary | ICD-10-CM | POA: Insufficient documentation

## 2019-11-19 DIAGNOSIS — E876 Hypokalemia: Secondary | ICD-10-CM | POA: Insufficient documentation

## 2019-11-19 DIAGNOSIS — R1084 Generalized abdominal pain: Secondary | ICD-10-CM | POA: Insufficient documentation

## 2019-11-19 DIAGNOSIS — R197 Diarrhea, unspecified: Secondary | ICD-10-CM | POA: Insufficient documentation

## 2019-11-19 DIAGNOSIS — R112 Nausea with vomiting, unspecified: Secondary | ICD-10-CM

## 2019-11-19 LAB — COMPREHENSIVE METABOLIC PANEL
ALT: 21 U/L (ref 0–44)
AST: 26 U/L (ref 15–41)
Albumin: 4.1 g/dL (ref 3.5–5.0)
Alkaline Phosphatase: 52 U/L (ref 38–126)
Anion gap: 10 (ref 5–15)
BUN: 8 mg/dL (ref 6–20)
CO2: 28 mmol/L (ref 22–32)
Calcium: 9.4 mg/dL (ref 8.9–10.3)
Chloride: 100 mmol/L (ref 98–111)
Creatinine, Ser: 1.22 mg/dL (ref 0.61–1.24)
GFR calc Af Amer: >60 mL/min (ref >60)
GFR calc non Af Amer: >60 mL/min (ref >60)
Glucose, Bld: 110 mg/dL — ABNORMAL HIGH (ref 70–99)
Potassium: 3.3 mmol/L — ABNORMAL LOW (ref 3.5–5.1)
Sodium: 138 mmol/L (ref 135–145)
Total Bilirubin: 1.7 mg/dL — ABNORMAL HIGH (ref 0.3–1.2)
Total Protein: 7.4 g/dL (ref 6.5–8.1)

## 2019-11-19 LAB — CBC
HCT: 49.9 % (ref 39.0–52.0)
Hemoglobin: 16.8 g/dL (ref 13.0–17.0)
MCH: 28.6 pg (ref 26.0–34.0)
MCHC: 33.7 g/dL (ref 30.0–36.0)
MCV: 84.9 fL (ref 80.0–100.0)
Platelets: 247 10*3/uL (ref 150–400)
RBC: 5.88 MIL/uL — ABNORMAL HIGH (ref 4.22–5.81)
RDW: 12.2 % (ref 11.5–15.5)
WBC: 6.4 10*3/uL (ref 4.0–10.5)
nRBC: 0 % (ref 0.0–0.2)

## 2019-11-19 LAB — URINALYSIS, ROUTINE W REFLEX MICROSCOPIC
Bilirubin Urine: NEGATIVE
Glucose, UA: NEGATIVE mg/dL
Hgb urine dipstick: NEGATIVE
Ketones, ur: 5 mg/dL — AB
Leukocytes,Ua: NEGATIVE
Nitrite: NEGATIVE
Protein, ur: NEGATIVE mg/dL
Specific Gravity, Urine: 1.024 (ref 1.005–1.030)
pH: 5 (ref 5.0–8.0)

## 2019-11-19 LAB — LIPASE, BLOOD: Lipase: 18 U/L (ref 11–51)

## 2019-11-19 MED ORDER — ONDANSETRON HCL 4 MG/2ML IJ SOLN
4.0000 mg | Freq: Once | INTRAMUSCULAR | Status: AC
  Administered 2019-11-20: 4 mg via INTRAVENOUS
  Filled 2019-11-19: qty 2

## 2019-11-19 MED ORDER — SODIUM CHLORIDE 0.9 % IV BOLUS
1000.0000 mL | Freq: Once | INTRAVENOUS | Status: AC
  Administered 2019-11-20: 1000 mL via INTRAVENOUS

## 2019-11-19 MED ORDER — SODIUM CHLORIDE 0.9% FLUSH
3.0000 mL | Freq: Once | INTRAVENOUS | Status: AC
  Administered 2019-11-20: 3 mL via INTRAVENOUS

## 2019-11-19 NOTE — ED Triage Notes (Signed)
Patient reports mid abdominal cramping with emesis today , denies diarrhea or fever .

## 2019-11-20 LAB — NOVEL CORONAVIRUS, NAA (HOSP ORDER, SEND-OUT TO REF LAB; TAT 18-24 HRS): SARS-CoV-2, NAA: NOT DETECTED

## 2019-11-20 MED ORDER — ONDANSETRON 4 MG PO TBDP: 4.0000 mg | ORAL_TABLET | Freq: Three times a day (TID) | ORAL | 0 refills | Status: DC | PRN

## 2019-11-20 NOTE — ED Notes (Signed)
Patient verbalizes understanding of discharge instructions. Opportunity for questioning and answers were provided. Armband removed by staff, pt discharged from ED ambulatory.   

## 2019-11-20 NOTE — Discharge Instructions (Addendum)
1. Medications: zofran, usual home medications °2. Treatment: rest, drink plenty of fluids, advance diet slowly °3. Follow Up: Please followup with your primary doctor in 2 days for discussion of your diagnoses and further evaluation after today's visit; if you do not have a primary care doctor use the resource guide provided to find one; Please return to the ER for persistent vomiting, high fevers or worsening symptoms ° °

## 2019-11-20 NOTE — ED Provider Notes (Signed)
[name] EMERGENCY DEPARTMENT Provider Note   CSN: [id number] Arrival date & time: 11/19/19  2129     History Chief Complaint  Patient presents with  . Abdominal Pain    Thomas Shepherd is a 24 y.o. male with a hx of no major medical problems presents to the Emergency Department complaining of acute, intermittent episodes of nausea and vomiting.  Patient reports he awoke several hours prior to arrival with abdominal cramping and a bad taste in his mouth.  He reports within a few minutes he had vomited x2.  Emesis is nonbloody and nonbilious.  He reports after that he had 2 episodes of loose stools.  He reports he has had some generalized abdominal cramping since that time but no persistent vomiting.  He has been able to sip on Gatorade without difficulty.  No specific aggravating or alleviating factors.  He is concerned this is something he may have eaten however he is unable to think of anything specific that may have been spoiled.  No known sick contacts.  He denies Covid contacts.  He denies headache, neck pain, fever, chills, chest pain, shortness of breath, weakness, dizziness, syncope, dysuria, back pain, cough..     The history is provided by the patient and medical records. No language interpreter was used.       Past Medical History:  Diagnosis Date  . Seasonal allergies     There are no problems to display for this patient.   History reviewed. No pertinent surgical history.     Family History  Problem Relation Age of Onset  . Healthy Mother   . Healthy Father     Social History   Tobacco Use  . Smoking status: Never Smoker  . Smokeless tobacco: Never Used  Substance Use Topics  . Alcohol use: No  . Drug use: No    Home Medications Prior to Admission medications   Medication Sig Start Date End Date Taking? Authorizing Provider  ondansetron (ZOFRAN ODT) 4 MG disintegrating tablet Take 1 tablet (4 mg total) by mouth every 8 (eight) hours  as needed for nausea or vomiting. 11/20/19   Ryosuke Ericksen, Dahlia Client, PA-C    Allergies    Patient has no known allergies.  Review of Systems   Review of Systems  Constitutional: Negative for appetite change, diaphoresis, fatigue, fever and unexpected weight change.  HENT: Negative for mouth sores.   Eyes: Negative for visual disturbance.  Respiratory: Negative for cough, chest tightness, shortness of breath and wheezing.   Cardiovascular: Negative for chest pain.  Gastrointestinal: Positive for abdominal pain, diarrhea, nausea and vomiting. Negative for constipation.  Endocrine: Negative for polydipsia, polyphagia and polyuria.  Genitourinary: Negative for dysuria, frequency, hematuria and urgency.  Musculoskeletal: Negative for back pain and neck stiffness.  Skin: Negative for rash.  Allergic/Immunologic: Negative for immunocompromised state.  Neurological: Negative for syncope, light-headedness and headaches.  Hematological: Does not bruise/bleed easily.  Psychiatric/Behavioral: Negative for sleep disturbance. The patient is not nervous/anxious.     Physical Exam Updated Vital Signs BP 123/66   Pulse (!) 51   Temp 98.2 F (36.8 C) (Oral)   Resp 18   Ht 6\' 6"  (1.981 m)   Wt 110 kg   SpO2 100%   BMI 28.02 kg/m   Physical Exam Vitals and nursing note reviewed.  Constitutional:      General: He is not in acute distress.    Appearance: He is not diaphoretic.  HENT:     Head:  Normocephalic.  Eyes:     General: No scleral icterus.    Conjunctiva/sclera: Conjunctivae normal.  Cardiovascular:     Rate and Rhythm: Normal rate and regular rhythm.     Pulses: Normal pulses.          Radial pulses are 2+ on the right side and 2+ on the left side.  Pulmonary:     Effort: No tachypnea, accessory muscle usage, prolonged expiration, respiratory distress or retractions.     Breath sounds: Normal breath sounds. No stridor.     Comments: Equal chest rise. No increased work of  breathing. Abdominal:     General: There is no distension.     Palpations: Abdomen is soft.     Tenderness: There is no abdominal tenderness. There is no guarding or rebound.  Musculoskeletal:     Cervical back: Normal range of motion.     Comments: Moves all extremities equally and without difficulty.  Skin:    General: Skin is warm and dry.     Capillary Refill: Capillary refill takes less than 2 seconds.  Neurological:     Mental Status: He is alert.     GCS: GCS eye subscore is 4. GCS verbal subscore is 5. GCS motor subscore is 6.     Comments: Speech is clear and goal oriented.  Psychiatric:        Mood and Affect: Mood normal.     ED Results / Procedures / Treatments   Labs (all labs ordered are listed, but only abnormal results are displayed) Labs Reviewed  COMPREHENSIVE METABOLIC PANEL - Abnormal; Notable for the following components:      Result Value   Potassium 3.3 (*)    Glucose, Bld 110 (*)    Total Bilirubin 1.7 (*)    All other components within normal limits  CBC - Abnormal; Notable for the following components:   RBC 5.88 (*)    All other components within normal limits  URINALYSIS, ROUTINE W REFLEX MICROSCOPIC - Abnormal; Notable for the following components:   Ketones, ur 5 (*)    All other components within normal limits  NOVEL CORONAVIRUS, NAA (HOSP ORDER, SEND-OUT TO REF LAB; TAT 18-24 HRS)  LIPASE, BLOOD    Procedures Procedures (including critical care time)  Medications Ordered in ED Medications  sodium chloride flush (NS) 0.9 % injection 3 mL (3 mLs Intravenous Given 11/20/19 0007)  sodium chloride 0.9 % bolus 1,000 mL (1,000 mLs Intravenous New Bag/Given 11/20/19 0007)  ondansetron (ZOFRAN) injection 4 mg (4 mg Intravenous Given 11/20/19 0006)    ED Course  I have reviewed the triage vital signs and the nursing notes.  Pertinent labs & imaging results that were available during my care of the patient were reviewed by me and considered in my  medical decision making (see chart for details).  Clinical Course as of Nov 19 141  Sat Nov 20, 2019  0122 Patient reports he is feeling better.  Has tolerated p.o. here in the emergency department.  On repeat exam, his abdomen remains soft and nontender.  No additional vomiting.   [HM]  0142 Mild hypokalemia.  Pt drinking Gatorade.  Encouraged bananas and green leafy vegetables for repletion.  Potassium(!): 3.3 [HM]    Clinical Course User Index [HM] Jaslen Adcox, Boyd Kerbs   MDM Rules/Calculators/A&P                       Presents with several episodes of vomiting and diarrhea  prior to arrival.  His symptoms have improved and he is well-appearing.  Afebrile without tachycardia or hypotension.  Labs show mild hypokalemia but otherwise reassuring.  Small amount of ketones in his urine but no evidence of urinary tract infection.  Will send outpatient Covid test.  Patient reports he does feel dehydrated.  Fluids given with improvement in symptoms.  On repeat exam abdomen is soft and nontender.  Highly doubt acute intra-abdominal pathology including colitis, appendicitis, cholecystitis.  Orthostatic VS for the past 24 hrs:  BP- Lying Pulse- Lying BP- Sitting Pulse- Sitting BP- Standing at 0 minutes Pulse- Standing at 0 minutes  11/20/19 0059 136/79 50 127/78 62 129/77 57   Patient is not orthostatic.  Will discharge home with Zofran.  Discussed close follow-up and reasons to return to the emergency department.   Final Clinical Impression(s) / ED Diagnoses Final diagnoses:  Generalized abdominal pain  Nausea vomiting and diarrhea  Hypokalemia due to excessive gastrointestinal loss of potassium    Rx / DC Orders ED Discharge Orders         Ordered    ondansetron (ZOFRAN ODT) 4 MG disintegrating tablet  Every 8 hours PRN     11/20/19 0127           Gwenneth Whiteman, Gwenlyn Perking 89/21/19 4174    Delora Fuel, MD [birth year] [phone]

## 2021-08-16 IMAGING — DX DG CHEST 2V
2 series · 2 of 2 positions shown · non-contrast
Comparison: 11/04/2018

CLINICAL DATA: Chest pain.

EXAM:
CHEST - 2 VIEW

[chest pa]
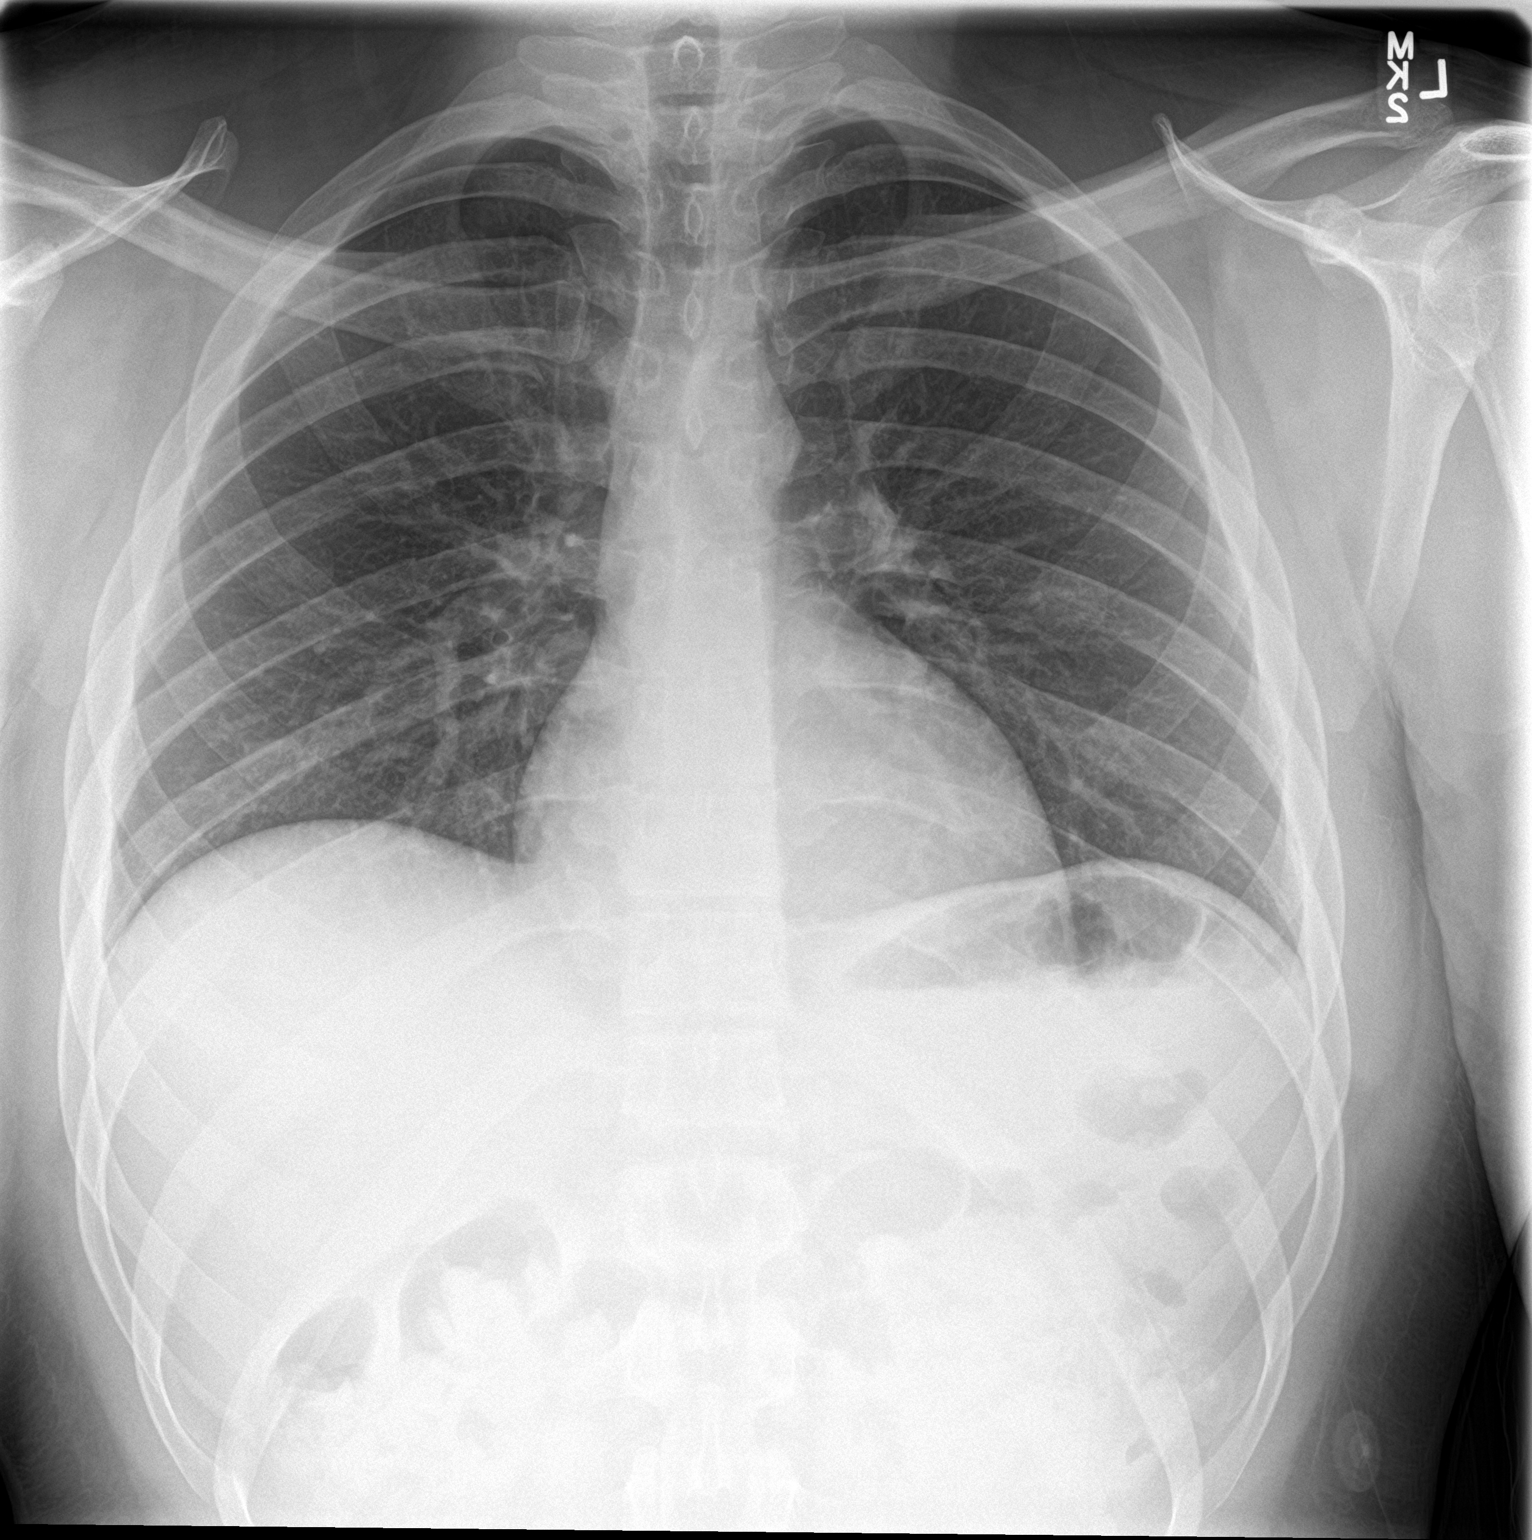

[chest lat]
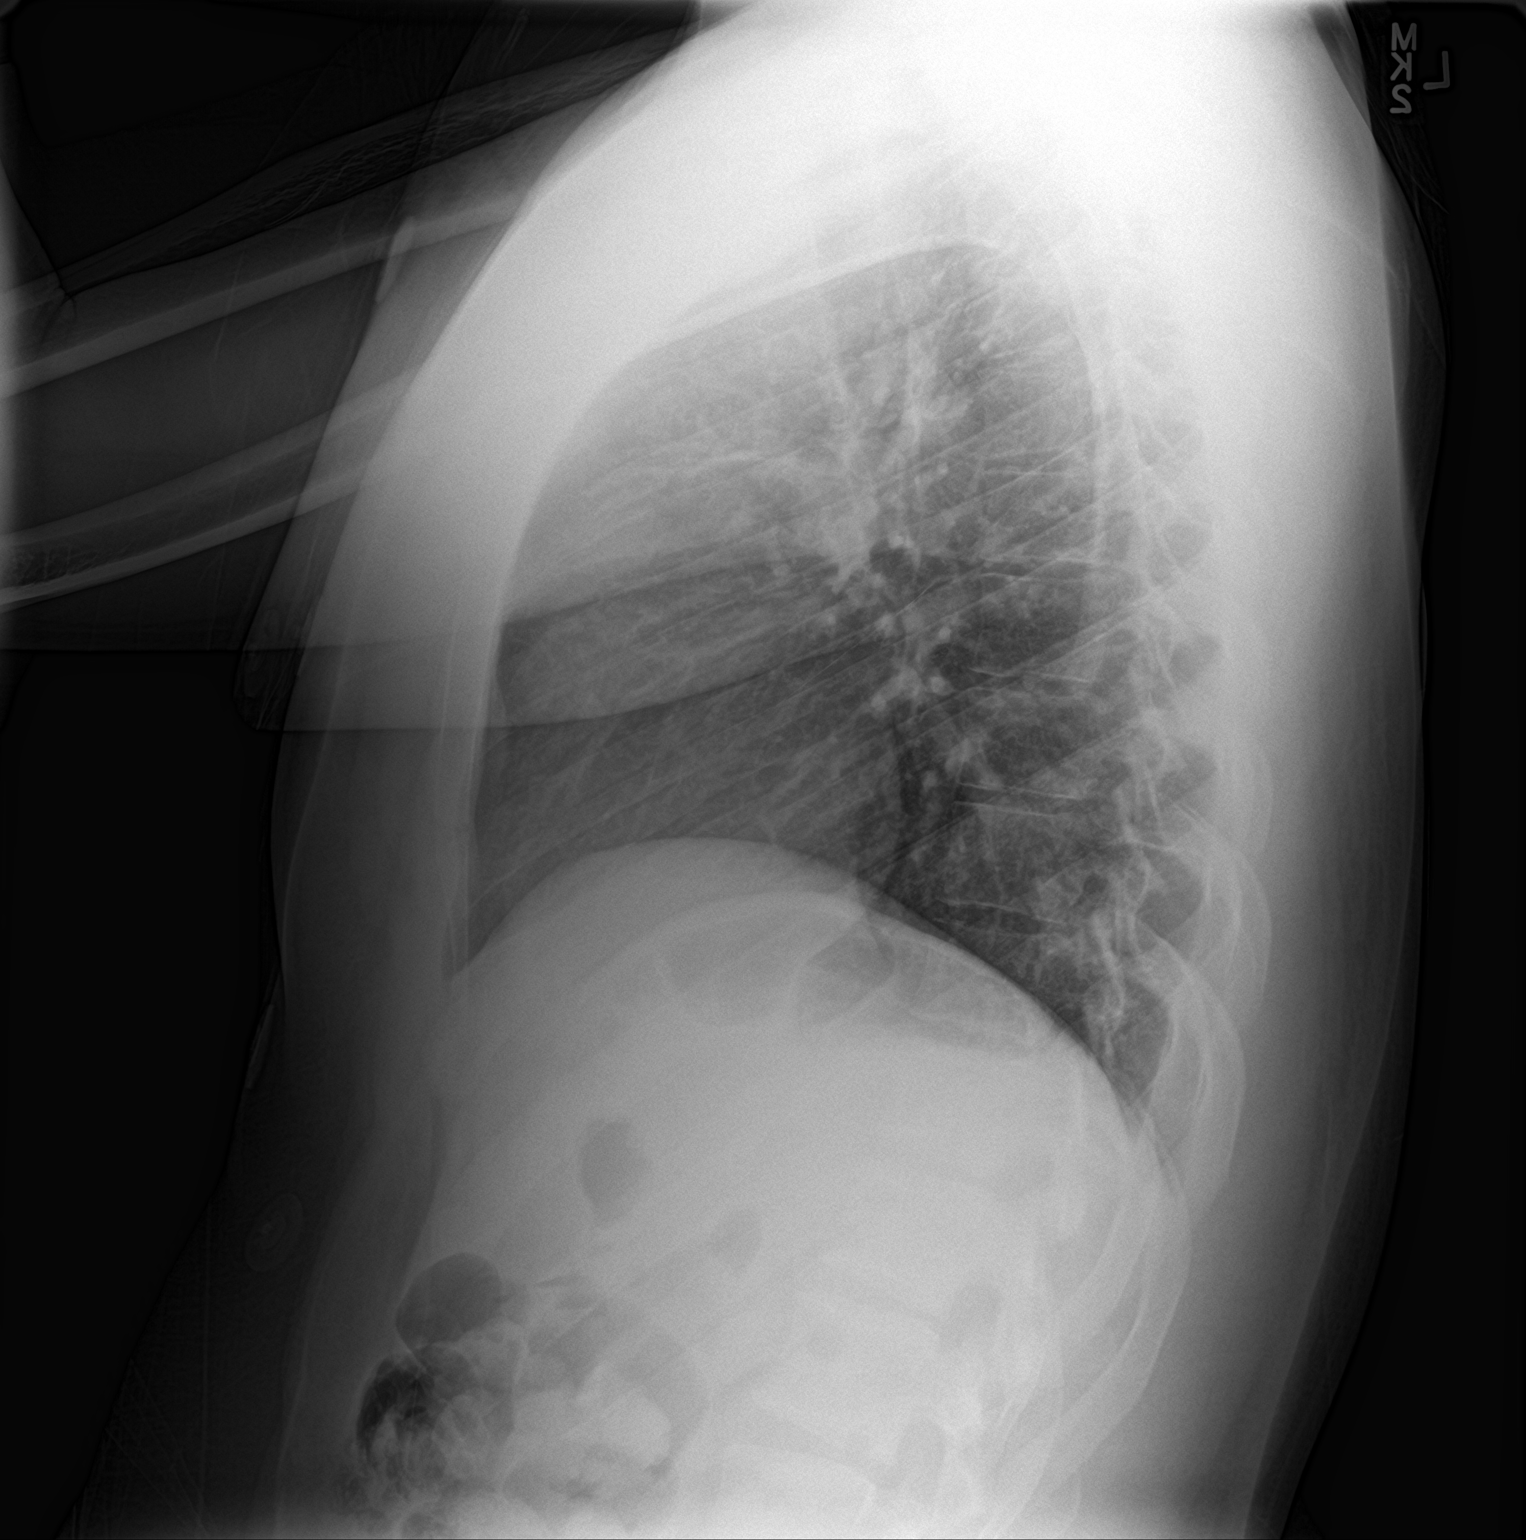

[2 of 2 positions shown; findings below may reference images not displayed]

FINDINGS: The heart size and mediastinal contours are within normal limits.
Both lungs are clear. The visualized skeletal structures are
unremarkable.
IMPRESSION: No active cardiopulmonary disease.

## 2022-02-14 ENCOUNTER — Ambulatory Visit (HOSPITAL_COMMUNITY)
Admission: EM | Admit: 2022-02-14 | Discharge: 2022-02-14 | Disposition: A | Discharge status: Home / Self Care | Payer: Self-pay | Attending: Emergency Medicine | Admitting: Emergency Medicine

## 2022-02-14 ENCOUNTER — Encounter (HOSPITAL_COMMUNITY): Payer: Self-pay

## 2022-02-14 DIAGNOSIS — S29011A Strain of muscle and tendon of front wall of thorax, initial encounter: Secondary | ICD-10-CM

## 2022-02-14 MED ORDER — CYCLOBENZAPRINE HCL 5 MG PO TABS: 5.0000 mg | ORAL_TABLET | Freq: Three times a day (TID) | ORAL | 0 refills | Status: DC | PRN

## 2022-02-14 NOTE — ED Provider Notes (Signed)
[facility]    CSN: [id number] Arrival date & time: 02/14/22  1105      History   Chief Complaint Chief Complaint  Patient presents with   Facial Pain    HPI Thomas Shepherd is a 26 y.o. male. He reports waking up with mild chest pain in L upper chest area. Reports he gets this kind of pain/soreness sometimes - thinks it's because he does a lot of lifting for work. Hurts when he pushes on the area. Denies shortness of breath or any other symptoms. Just wants to make sure it isn't a problem with his heart. Also reports had wisdom teeth out yesterday but has no complaints or concerns with surgical area.   HPI  Past Medical History:  Diagnosis Date   Seasonal allergies     There are no problems to display for this patient.   History reviewed. No pertinent surgical history.     Home Medications    Prior to Admission medications   Medication Sig Start Date End Date Taking? Authorizing Provider  cyclobenzaprine (FLEXERIL) 5 MG tablet Take 1 tablet (5 mg total) by mouth 3 (three) times daily as needed for muscle spasms. 02/14/22  Yes Cathlyn Parsons, NP  ondansetron (ZOFRAN ODT) 4 MG disintegrating tablet Take 1 tablet (4 mg total) by mouth every 8 (eight) hours as needed for nausea or vomiting. 11/20/19   Muthersbaugh, Dahlia Client, PA-C    Family History Family History  Problem Relation Age of Onset   Healthy Mother    Healthy Father     Social History Social History   Tobacco Use   Smoking status: Never   Smokeless tobacco: Never  Substance Use Topics   Alcohol use: No   Drug use: No     Allergies   Patient has no known allergies.   Review of Systems Review of Systems   Physical Exam Triage Vital Signs ED Triage Vitals  Enc Vitals Group     BP 02/14/22 1207 (!) 145/92     Pulse Rate 02/14/22 1207 (!) 54     Resp 02/14/22 1207 16     Temp 02/14/22 1207 98 F (36.7 C)     Temp Source 02/14/22 1207 Oral     SpO2 02/14/22 1207 99 %     Weight  --      Height --      Head Circumference --      Peak Flow --      Pain Score 02/14/22 1256 0     Pain Loc --      Pain Edu? --      Excl. in GC? --    No data found.  Updated Vital Signs BP (!) 145/92 (BP Location: Left Arm)   Pulse (!) 54   Temp 98 F (36.7 C) (Oral)   Resp 16   SpO2 99%   Visual Acuity Right Eye Distance:   Left Eye Distance:   Bilateral Distance:    Right Eye Near:   Left Eye Near:    Bilateral Near:     Physical Exam Constitutional:      General: He is not in acute distress.    Appearance: Normal appearance. He is not ill-appearing.  Cardiovascular:     Rate and Rhythm: Normal rate and regular rhythm.  Pulmonary:     Effort: Pulmonary effort is normal.     Breath sounds: Normal breath sounds.  Chest:     Chest wall: No deformity or  swelling.    Skin:    Findings: No rash or wound.  Neurological:     Mental Status: He is alert.     UC Treatments / Results  Labs (all labs ordered are listed, but only abnormal results are displayed) Labs Reviewed - No data to display  EKG   Radiology No results found.  Procedures Procedures (including critical care time)  Medications Ordered in UC Medications - No data to display  Initial Impression / Assessment and Plan / UC Course  I have reviewed the triage vital signs and the nursing notes.  Pertinent labs & imaging results that were available during my care of the patient were reviewed by me and considered in my medical decision making (see chart for details).    Likely muscle pain given tenderness to palpation and lack of other symptoms.   Final Clinical Impressions(s) / UC Diagnoses   Final diagnoses:  Muscle strain of chest wall, initial encounter     Discharge Instructions      Continue taking the pain medicine for your teeth prescribed by your dentist. When you are out of it, you can switch back to using ibuprofen or tylenol for pain per package directions.   The muscle  relaxer (cyclobenzaprine) may make you sleepy, so if you need to drive or work, you can take it only at night instead of 3 times a day.    ED Prescriptions     Medication Sig Dispense Auth. Provider   cyclobenzaprine (FLEXERIL) 5 MG tablet Take 1 tablet (5 mg total) by mouth 3 (three) times daily as needed for muscle spasms. 21 tablet Cathlyn Parsons, NP      PDMP not reviewed this encounter.   Cathlyn Parsons, NP 02/14/22 1400

## 2022-02-14 NOTE — ED Triage Notes (Signed)
C/o left-sided facial pain radiating down to his arm. Pt had a tooth repair yesterday and the pain started after his visit.

## 2022-02-14 NOTE — Discharge Instructions (Signed)
Continue taking the pain medicine for your teeth prescribed by your dentist. When you are out of it, you can switch back to using ibuprofen or tylenol for pain per package directions.   The muscle relaxer (cyclobenzaprine) may make you sleepy, so if you need to drive or work, you can take it only at night instead of 3 times a day.

## 2022-06-26 ENCOUNTER — Other Ambulatory Visit: Payer: Self-pay

## 2022-06-26 ENCOUNTER — Emergency Department (HOSPITAL_COMMUNITY): Payer: 59

## 2022-06-26 ENCOUNTER — Emergency Department (HOSPITAL_COMMUNITY)
Admission: EM | Admit: 2022-06-26 | Discharge: 2022-06-26 | Disposition: A | Discharge status: Home / Self Care | Payer: 59 | Attending: Emergency Medicine | Admitting: Emergency Medicine

## 2022-06-26 DIAGNOSIS — M940 Chondrocostal junction syndrome [Tietze]: Secondary | ICD-10-CM | POA: Diagnosis not present

## 2022-06-26 DIAGNOSIS — R0789 Other chest pain: Secondary | ICD-10-CM

## 2022-06-26 DIAGNOSIS — R079 Chest pain, unspecified: Secondary | ICD-10-CM | POA: Diagnosis not present

## 2022-06-26 MED ORDER — NAPROXEN 500 MG PO TABS
500.0000 mg | ORAL_TABLET | Freq: Once | ORAL | Status: AC
  Administered 2022-06-26: 500 mg via ORAL
  Filled 2022-06-26: qty 1

## 2022-06-26 MED ORDER — DICLOFENAC SODIUM 1 % EX GEL: 2.0000 g | Freq: Four times a day (QID) | CUTANEOUS | 0 refills | Status: DC

## 2022-06-26 MED ORDER — NAPROXEN 375 MG PO TABS: 375.0000 mg | ORAL_TABLET | Freq: Two times a day (BID) | ORAL | 0 refills | Status: DC

## 2022-06-26 MED ORDER — CYCLOBENZAPRINE HCL 10 MG PO TABS
5.0000 mg | ORAL_TABLET | Freq: Once | ORAL | Status: AC
  Administered 2022-06-26: 5 mg via ORAL
  Filled 2022-06-26: qty 1

## 2022-06-26 MED ORDER — CYCLOBENZAPRINE HCL 10 MG PO TABS: 5.0000 mg | ORAL_TABLET | Freq: Three times a day (TID) | ORAL | 0 refills | Status: DC | PRN

## 2022-06-26 NOTE — ED Triage Notes (Signed)
Pt presents with left shoulder "ache" x 3 days. Does lift a lot at work.\

## 2022-06-26 NOTE — ED Provider Notes (Signed)
[facility]-EMERGENCY DEPT Provider Note   CSN: [id number] Arrival date & time: 06/26/22  1655     History  Chief Complaint  Patient presents with   Shoulder Pain    Thomas Shepherd is a 26 y.o. male.  With no significant past medical history who presents with atraumatic ongoing intermittent left-sided chest wall pain.  He says this has been ongoing intermittently for months.  It is mainly in between his rib spaces and only worse with palpation.  It does not hurt with lifting or moving, no pain with exerting himself or deep breathing.  He has had no other associated symptoms.  He has had no fevers, no URI symptoms, no shortness of breath, no cough, no vomiting or diaphoresis.  He does note that he lifts often.  He was intermittently taking muscle relaxers but ran out of them but did note there was some improvement with pain.   Shoulder Pain      Home Medications Prior to Admission medications   Medication Sig Start Date End Date Taking? Authorizing Provider  cyclobenzaprine (FLEXERIL) 10 MG tablet Take 0.5 tablets (5 mg total) by mouth 3 (three) times daily as needed for muscle spasms. 06/26/22  Yes Mardene Sayer, MD  diclofenac Sodium (VOLTAREN) 1 % GEL Apply 2 g topically 4 (four) times daily. 06/26/22  Yes Mardene Sayer, MD  naproxen (NAPROSYN) 375 MG tablet Take 1 tablet (375 mg total) by mouth 2 (two) times daily. 06/26/22  Yes Mardene Sayer, MD  cyclobenzaprine (FLEXERIL) 5 MG tablet Take 1 tablet (5 mg total) by mouth 3 (three) times daily as needed for muscle spasms. 02/14/22   Cathlyn Parsons, NP  ondansetron (ZOFRAN ODT) 4 MG disintegrating tablet Take 1 tablet (4 mg total) by mouth every 8 (eight) hours as needed for nausea or vomiting. 11/20/19   Muthersbaugh, Dahlia Client, PA-C      Allergies    Patient has no known allergies.    Review of Systems   Review of Systems  Physical Exam Updated Vital Signs BP 129/82 (BP Location: Right Arm)    Pulse (!) 56   Temp 98 F (36.7 C) (Oral)   Resp 16   SpO2 100%  Physical Exam Constitutional: Alert and oriented. Well appearing and in no distress. Eyes: Conjunctivae are normal. ENT      Head: Normocephalic and atraumatic.      Nose: No congestion.      Mouth/Throat: Mucous membranes are moist.      Neck: No stridor. Cardiovascular: S1, S2,  Normal and symmetric distal pulses are present in all extremities.Warm and well perfused. Chest wall: There is tenderness to palpation in the anterior Mid chest wall costal cartilage spaces with no external evidence of inflammation, no erythema or skin changes Respiratory: Normal respiratory effort. Breath sounds are normal. Gastrointestinal: Soft Musculoskeletal: Normal range of motion in all extremities.      Right lower leg: No tenderness or edema.      Left lower leg: No tenderness or edema. Neurologic: Normal speech and language. No gross focal neurologic deficits are appreciated. Skin: Skin is warm, dry and intact. No rash noted. Psychiatric: Mood and affect are normal. Speech and behavior are normal.  ED Results / Procedures / Treatments   Labs (all labs ordered are listed, but only abnormal results are displayed) Labs Reviewed - No data to display  EKG None  Radiology DG Chest 2 View  Result Date: 06/26/2022 CLINICAL DATA:  Left-sided  chest pain, left shoulder ache for 3 days EXAM: CHEST - 2 VIEW COMPARISON:  10/17/2019 FINDINGS: Frontal and lateral views of the chest demonstrate an unremarkable cardiac silhouette. No airspace disease, effusion, or pneumothorax. No acute bony abnormalities. IMPRESSION: 1. No acute intrathoracic process. Electronically Signed   By: Randa Ngo M.D.   On: 06/26/2022 17:59    Procedures Procedures    Medications Ordered in ED Medications  naproxen (NAPROSYN) tablet 500 mg (500 mg Oral Given 06/26/22 1819)  cyclobenzaprine (FLEXERIL) tablet 5 mg (5 mg Oral Given 06/26/22 1819)    ED Course/  Medical Decision Making/ A&P                           Medical Decision Making Rusell M Shepherd is a 26 y.o. male.  With no significant past medical history who presents with atraumatic ongoing intermittent left-sided chest wall pain.  He says this has been ongoing intermittently for months.  Patient's clinical history and exam is most consistent with costochondritis or nonspecific musculoskeletal pain.  He had a chest x-ray obtained today with no evidence of pneumothorax, no pleural effusion, no pneumonia.  He has no risk factors or concerning symptoms suggestive of ACS.  He was given naproxen and Flexeril with improvement of pain.  Discharged with prescriptions for Flexeril, naproxen and Voltaren gel and advised close follow-up with PCP.  He is in agreement with plan and safe for discharge home with return precaution discussed.  Amount and/or Complexity of Data Reviewed Radiology: ordered.  Risk Prescription drug management.   Final Clinical Impression(s) / ED Diagnoses Final diagnoses:  Chest wall pain  Costochondritis    Rx / DC Orders ED Discharge Orders          Ordered    naproxen (NAPROSYN) 375 MG tablet  2 times daily        06/26/22 1826    cyclobenzaprine (FLEXERIL) 10 MG tablet  3 times daily PRN        06/26/22 1826    diclofenac Sodium (VOLTAREN) 1 % GEL  4 times daily        06/26/22 1826              Elgie Congo, MD 06/26/22 1827

## 2022-06-26 NOTE — Discharge Instructions (Addendum)
You were seen in the emergency department today for chest pain.  We suspect you have costochondritis.  Your chest x-ray was unremarkable.  Take the medicines prescribed as needed for pain control.  Return to the emergency department immediately if you develop recurrent, severe chest pain, shortness of breath, fainting spells, sudden sweatiness, or any other concerning symptoms.

## 2024-01-28 ENCOUNTER — Ambulatory Visit (HOSPITAL_COMMUNITY)
Admission: EM | Admit: 2024-01-28 | Discharge: 2024-01-28 | Disposition: A | Discharge status: Home / Self Care | Payer: Self-pay | Attending: Sports Medicine | Admitting: Sports Medicine

## 2024-01-28 ENCOUNTER — Encounter (HOSPITAL_COMMUNITY): Payer: Self-pay

## 2024-01-28 DIAGNOSIS — M545 Low back pain, unspecified: Secondary | ICD-10-CM

## 2024-01-28 MED ORDER — KETOROLAC TROMETHAMINE 60 MG/2ML IM SOLN
60.0000 mg | Freq: Once | INTRAMUSCULAR | Status: AC
  Administered 2024-01-28: 60 mg via INTRAMUSCULAR

## 2024-01-28 MED ORDER — KETOROLAC TROMETHAMINE 30 MG/ML IJ SOLN
60.0000 mg | Freq: Once | INTRAMUSCULAR | Status: AC
  Administered 2024-01-28: 60 mg via INTRAMUSCULAR

## 2024-01-28 MED ORDER — METHOCARBAMOL 500 MG PO TABS: 500.0000 mg | ORAL_TABLET | Freq: Two times a day (BID) | ORAL | 0 refills | Status: AC

## 2024-01-28 MED ORDER — KETOROLAC TROMETHAMINE 60 MG/2ML IM SOLN
INTRAMUSCULAR | Status: AC
  Filled 2024-01-28: qty 2

## 2024-01-28 NOTE — Discharge Instructions (Signed)
 You were seen for low back pain. I think this is mechanical back pain with muscle spasms. You received an antiinflammatory injection of toradol  today which should help with your pain over the next 24 hours, however it is important to try to stay ahead of the pain. Best approach is to pain would be starting tomorrow take ibuprofen  600 to 800 mg every 8 hours as needed.  You can also take Tylenol  1000 mg every 8 hours as needed for pain.  Heating pads can be helpful as well.  I also sent you a prescription for Robaxin which is a muscle relaxer -this medication can be sedating so start with it at night and when driving/operating heavy machinery while on this medication. Start the back stretches once your back pain is improving  If this is failing to improve over the next 3 to 4 weeks I would recommend follow-up with sports medicine. [facility] Address: [address] [place], [place], [place] [address] Phone: [phone]

## 2024-01-28 NOTE — ED Provider Notes (Signed)
 [facility]    CSN: [id number] Arrival date & time: 01/28/24  0840      History   Chief Complaint Chief Complaint  Patient presents with   Back Pain    HPI Thomas Shepherd is a 28 y.o. male without significant past medical history here with 1 day of bilateral low back pain.  He works at [employer] and states that he frequently has to do heavy lifting. Yesterday morning while getting dressed had acute onset of pain in the low back. Radiates up his back to his mid thoracic area, but not down his legs. No leg weakness or numbness. Denies previous back injuries. Took some Doan back pain medication from his gma yesterday with minimal relief. No fevers/chills, incontinence, IVDU, saddle anesthesia.   Back Pain   Past Medical History:  Diagnosis Date   Seasonal allergies     There are no active problems to display for this patient.   History reviewed. No pertinent surgical history.     Home Medications    Prior to Admission medications   Medication Sig Start Date End Date Taking? Authorizing Provider  methocarbamol (ROBAXIN) 500 MG tablet Take 1 tablet (500 mg total) by mouth 2 (two) times daily. 01/28/24  Yes Marliss Simple, MD    Family History Family History  Problem Relation Age of Onset   Healthy Mother    Healthy Father     Social History Social History   Tobacco Use   Smoking status: Never   Smokeless tobacco: Never  Substance Use Topics   Alcohol use: No   Drug use: No     Allergies   Patient has no known allergies.   Review of Systems Review of Systems  Musculoskeletal:  Positive for back pain.     Physical Exam Triage Vital Signs ED Triage Vitals [01/28/24 0911]  Encounter Vitals Group     BP 125/80     Systolic BP Percentile      Diastolic BP Percentile      Pulse Rate 75     Resp 18     Temp 98 F (36.7 C)     Temp Source Oral     SpO2 98 %     Weight      Height      Head Circumference      Peak Flow      Pain Score 10      Pain Loc      Pain Education      Exclude from Growth Chart    No data found.  Updated Vital Signs BP 125/80 (BP Location: Left Arm)   Pulse 75   Temp 98 F (36.7 C) (Oral)   Resp 18   SpO2 98%    Physical Exam Constitutional:      Appearance: Normal appearance. He is not ill-appearing or toxic-appearing.     Comments: Uncomfortable in appearance  HENT:     Head: Normocephalic and atraumatic.  Eyes:     Extraocular Movements: Extraocular movements intact.     Pupils: Pupils are equal, round, and reactive to light.  Cardiovascular:     Rate and Rhythm: Normal rate.     Pulses: Normal pulses.     Heart sounds: Normal heart sounds.  Pulmonary:     Effort: Pulmonary effort is normal.     Breath sounds: Normal breath sounds.  Abdominal:     Palpations: Abdomen is soft.     Tenderness: There is no abdominal  tenderness. There is no right CVA tenderness or left CVA tenderness.  Musculoskeletal:     Cervical back: Normal range of motion and neck supple. No rigidity or tenderness.     Comments: No gross deformity, scoliosis. TTP paraspinals of lower thoracic and lumbar spine bilaterally.  No midline or bony TTP. No SI joint or glute TTP. ROM limited in low back in all directions.  Strengh LEs 5/5 all muscle groups.   2+ MSRs in patellar and achilles tendons, equal bilaterally. Negative SLRs and seated slump test. Sensation intact to light touch bilaterally.    Skin:    General: Skin is warm and dry.     Capillary Refill: Capillary refill takes less than 2 seconds.     Findings: No rash.  Neurological:     General: No focal deficit present.     Mental Status: He is alert and oriented to person, place, and time.     Sensory: No sensory deficit.     Deep Tendon Reflexes: Reflexes normal.  Psychiatric:        Mood and Affect: Mood normal.        Behavior: Behavior normal.      UC Treatments / Results  Labs (all labs ordered are listed, but only abnormal results  are displayed) Labs Reviewed - No data to display  EKG   Radiology No results found.  Procedures Procedures (including critical care time)  Medications Ordered in UC Medications  ketorolac  (TORADOL ) 30 MG/ML injection 60 mg (has no administration in time range)    Initial Impression / Assessment and Plan / UC Course  I have reviewed the triage vital signs and the nursing notes.  Pertinent labs & imaging results that were available during my care of the patient were reviewed by me and considered in my medical decision making (see chart for details).   Patient is well-appearing, normotensive, afebrile, not tachycardic, not tachypneic, oxygenating well on room air.   Acute bilateral low back pain without sciatica Overall, vitals and exam are reassuring and consistent with lumbago without radiculopathy. Mechanical back pain with muscle spasms on exam. No red flags. Supportive care discussed - heating pad, [place] back extensions, ibuprofen  600-800mg  TID and Tylenol  650-1000mg  TID as needed.  He did receive an IM toradol  60mg  inj in office today and Rx for Robaxin was sent to his pharmacy and reviewed the sedating nature of this medication. F/u with Sports Medicine in 3-4 weeks if failing to improve. Return and ER precautions discussed Patient's questions were answered and they are in agreement with this plan  Final Clinical Impressions(s) / UC Diagnoses   Final diagnoses:  Acute bilateral low back pain without sciatica     Discharge Instructions      You were seen for low back pain. I think this is mechanical back pain with muscle spasms. You received an antiinflammatory injection of toradol  today which should help with your pain over the next 24 hours, however it is important to try to stay ahead of the pain. Best approach is to pain would be starting tomorrow take ibuprofen  600 to 800 mg every 8 hours as needed.  You can also take Tylenol  1000 mg every 8 hours as needed for  pain.  Heating pads can be helpful as well.  I also sent you a prescription for Robaxin which is a muscle relaxer -this medication can be sedating so start with it at night and when driving/operating heavy machinery while on this medication. Start the back  stretches once your back pain is improving  If this is failing to improve over the next 3 to 4 weeks I would recommend follow-up with sports medicine. [facility] Address: [address] [place], [place], [place] [address] Phone: [phone]   ED Prescriptions     Medication Sig Dispense Auth. Provider   methocarbamol (ROBAXIN) 500 MG tablet Take 1 tablet (500 mg total) by mouth 2 (two) times daily. 20 tablet Whitlee Sluder D, MD      PDMP not reviewed this encounter.   Marliss Simple, MD 01/28/24 1011

## 2024-01-28 NOTE — ED Notes (Signed)
 Ketorlac 60 mg given. Provider entered in error.

## 2024-01-28 NOTE — ED Triage Notes (Signed)
 Patient presents to the office for lower back pain that started yesterday. Patient states he was get dress when pain occurred.

## 2024-09-22 ENCOUNTER — Ambulatory Visit (HOSPITAL_COMMUNITY)
Admission: EM | Admit: 2024-09-22 | Discharge: 2024-09-22 | Disposition: A | Discharge status: Home / Self Care | Payer: Self-pay | Attending: Internal Medicine | Admitting: Internal Medicine

## 2024-09-22 ENCOUNTER — Encounter (HOSPITAL_COMMUNITY): Payer: Self-pay | Admitting: *Deleted

## 2024-09-22 DIAGNOSIS — K529 Noninfective gastroenteritis and colitis, unspecified: Secondary | ICD-10-CM

## 2024-09-22 DIAGNOSIS — R112 Nausea with vomiting, unspecified: Secondary | ICD-10-CM

## 2024-09-22 MED ORDER — ONDANSETRON 4 MG PO TBDP: 4.0000 mg | ORAL_TABLET | Freq: Three times a day (TID) | ORAL | 0 refills | Status: AC | PRN

## 2024-09-22 NOTE — Discharge Instructions (Addendum)
 Symptoms and physical exam findings are most consistent with gastroenteritis causing nausea, vomiting and diarrhea.  This is normally a self-limiting condition that can last 2 to 4 days.  The most important thing is to stay hydrated by drinking plenty of fluids even if you do not feel like taking in solid foods.  If you become unable to stay hydrated or your symptoms last longer than 4 days then you would need to return to urgent care or go to the emergency room for further evaluation.  Today, your physical exam findings and vital signs are reassuring.  We will call in Zofran  4 mg every 8 hours as needed for nausea or vomiting.  For the diarrhea you can use Imodium  A-D over-the-counter.  Can return to urgent care if needed.

## 2024-09-22 NOTE — ED Triage Notes (Signed)
 Pt states he has had diarrhea and vomiting since yesterday morning. He denies any nausea currently last time he vomited at 9am.   He needs a work note

## 2024-09-22 NOTE — ED Provider Notes (Signed)
 " [facility]    CSN: [id number] Arrival date & time: 09/22/24  1137      History   Chief Complaint Chief Complaint  Patient presents with   Emesis   Diarrhea   Letter for School/Work    HPI Thomas Shepherd is a 28 y.o. male.   28 year old male who presents urgent care with complaints of nausea, vomiting and diarrhea.  His symptoms started yesterday.  The last time he vomited was this morning around 9 AM.  He had diarrhea this morning as well.  He is able to drink fluids but relates that when he tried to do some chicken noodle soup last night that it made him feel sick.  He has not had any fevers, cough, congestion.  He relates that his children have had similar symptoms.   Emesis Associated symptoms: diarrhea   Associated symptoms: no abdominal pain, no arthralgias, no chills, no cough, no fever and no sore throat   Diarrhea Associated symptoms: vomiting   Associated symptoms: no abdominal pain, no arthralgias, no chills and no fever     Past Medical History:  Diagnosis Date   Seasonal allergies     There are no active problems to display for this patient.   History reviewed. No pertinent surgical history.     Home Medications    Prior to Admission medications  Medication Sig Start Date End Date Taking? Authorizing Provider  ondansetron  (ZOFRAN -ODT) 4 MG disintegrating tablet Take 1 tablet (4 mg total) by mouth every 8 (eight) hours as needed for nausea or vomiting. 09/22/24  Yes Birda Didonato A, PA-C  methocarbamol  (ROBAXIN ) 500 MG tablet Take 1 tablet (500 mg total) by mouth 2 (two) times daily. 01/28/24   Levorn Prentice BIRCH, MD    Family History Family History  Problem Relation Age of Onset   Healthy Mother    Healthy Father     Social History Social History[1]   Allergies   Patient has no known allergies.   Review of Systems Review of Systems  Constitutional:  Positive for appetite change. Negative for chills and fever.  HENT:   Negative for ear pain and sore throat.   Eyes:  Negative for pain and visual disturbance.  Respiratory:  Negative for cough and shortness of breath.   Cardiovascular:  Negative for chest pain and palpitations.  Gastrointestinal:  Positive for diarrhea, nausea and vomiting. Negative for abdominal pain.  Genitourinary:  Negative for dysuria and hematuria.  Musculoskeletal:  Negative for arthralgias and back pain.  Skin:  Negative for color change and rash.  Neurological:  Negative for seizures and syncope.  All other systems reviewed and are negative.    Physical Exam Triage Vital Signs ED Triage Vitals  Encounter Vitals Group     BP 09/22/24 1232 128/74     Girls Systolic BP Percentile --      Girls Diastolic BP Percentile --      Boys Systolic BP Percentile --      Boys Diastolic BP Percentile --      Pulse Rate 09/22/24 1232 64     Resp 09/22/24 1232 16     Temp 09/22/24 1232 98.6 F (37 C)     Temp Source 09/22/24 1232 Oral     SpO2 09/22/24 1232 96 %     Weight --      Height --      Head Circumference --      Peak Flow --  Pain Score 09/22/24 1231 0     Pain Loc --      Pain Education --      Exclude from Growth Chart --    No data found.  Updated Vital Signs BP 128/74 (BP Location: Left Arm)   Pulse 64   Temp 98.6 F (37 C) (Oral)   Resp 16   SpO2 96%   Visual Acuity Right Eye Distance:   Left Eye Distance:   Bilateral Distance:    Right Eye Near:   Left Eye Near:    Bilateral Near:     Physical Exam Vitals and nursing note reviewed.  Constitutional:      General: He is not in acute distress.    Appearance: He is well-developed.  HENT:     Head: Normocephalic and atraumatic.     Right Ear: Tympanic membrane normal.     Left Ear: Tympanic membrane normal.     Nose: Nose normal.     Mouth/Throat:     Mouth: Mucous membranes are moist.     Pharynx: Oropharynx is clear.  Eyes:     Conjunctiva/sclera: Conjunctivae normal.  Cardiovascular:      Rate and Rhythm: Normal rate and regular rhythm.     Heart sounds: No murmur heard. Pulmonary:     Effort: Pulmonary effort is normal. No respiratory distress.     Breath sounds: Normal breath sounds.  Abdominal:     General: Bowel sounds are normal.     Palpations: Abdomen is soft.     Tenderness: There is no abdominal tenderness.     Hernia: No hernia is present.  Musculoskeletal:        General: No swelling.     Cervical back: Neck supple.  Skin:    General: Skin is warm and dry.     Capillary Refill: Capillary refill takes less than 2 seconds.  Neurological:     Mental Status: He is alert.  Psychiatric:        Mood and Affect: Mood normal.      UC Treatments / Results  Labs (all labs ordered are listed, but only abnormal results are displayed) Labs Reviewed - No data to display  EKG   Radiology No results found.  Procedures Procedures (including critical care time)  Medications Ordered in UC Medications - No data to display  Initial Impression / Assessment and Plan / UC Course  I have reviewed the triage vital signs and the nursing notes.  Pertinent labs & imaging results that were available during my care of the patient were reviewed by me and considered in my medical decision making (see chart for details).     Gastroenteritis  Nausea vomiting and diarrhea   Symptoms and physical exam findings are most consistent with gastroenteritis causing nausea, vomiting and diarrhea.  This is normally a self-limiting condition that can last 2 to 4 days.  The most important thing is to stay hydrated by drinking plenty of fluids even if you do not feel like taking in solid foods.  If you become unable to stay hydrated or your symptoms last longer than 4 days then you would need to return to urgent care or go to the emergency room for further evaluation.  Today, your physical exam findings and vital signs are reassuring.  We will call in Zofran  4 mg every 8 hours as needed  for nausea or vomiting.  For the diarrhea you can use Imodium  A-D over-the-counter.  Can return to urgent  care if needed.  Final Clinical Impressions(s) / UC Diagnoses   Final diagnoses:  Gastroenteritis  Nausea vomiting and diarrhea     Discharge Instructions      Symptoms and physical exam findings are most consistent with gastroenteritis causing nausea, vomiting and diarrhea.  This is normally a self-limiting condition that can last 2 to 4 days.  The most important thing is to stay hydrated by drinking plenty of fluids even if you do not feel like taking in solid foods.  If you become unable to stay hydrated or your symptoms last longer than 4 days then you would need to return to urgent care or go to the emergency room for further evaluation.  Today, your physical exam findings and vital signs are reassuring.  We will call in Zofran  4 mg every 8 hours as needed for nausea or vomiting.  For the diarrhea you can use Imodium  A-D over-the-counter.  Can return to urgent care if needed.     ED Prescriptions     Medication Sig Dispense Auth. Provider   ondansetron  (ZOFRAN -ODT) 4 MG disintegrating tablet Take 1 tablet (4 mg total) by mouth every 8 (eight) hours as needed for nausea or vomiting. 20 tablet Teresa Almarie LABOR, [place]      PDMP not reviewed this encounter.    [1]  Social History Tobacco Use   Smoking status: Never   Smokeless tobacco: Never  Vaping Use   Vaping status: Never Used  Substance Use Topics   Alcohol use: No   Drug use: No     Teresa Almarie LABOR, PA-C 09/22/24 1305  "
